# Patient Record
Sex: Female | Born: 1985 | Race: White | Hispanic: No | Marital: Married | Smoking: Current every day smoker
Health system: Southern US, Community
[De-identification: ages and names within clinical notes are randomized; demographics above are authoritative.]

## PROBLEM LIST (undated history)

## (undated) DIAGNOSIS — E7800 Pure hypercholesterolemia, unspecified: Secondary | ICD-10-CM

## (undated) DIAGNOSIS — T7840XA Allergy, unspecified, initial encounter: Secondary | ICD-10-CM

## (undated) HISTORY — DX: Allergy, unspecified, initial encounter: T78.40XA

## (undated) HISTORY — PX: TONSILLECTOMY: SUR1361

---
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
## 2004-07-26 ENCOUNTER — Ambulatory Visit: Payer: Self-pay | Admitting: Family Medicine

## 2004-12-22 ENCOUNTER — Ambulatory Visit: Payer: Self-pay | Admitting: Family Medicine

## 2005-01-04 ENCOUNTER — Ambulatory Visit: Payer: Self-pay | Admitting: Family Medicine

## 2005-05-18 ENCOUNTER — Ambulatory Visit: Payer: Self-pay | Admitting: Family Medicine

## 2005-08-02 ENCOUNTER — Ambulatory Visit: Payer: Self-pay | Admitting: Family Medicine

## 2006-06-22 ENCOUNTER — Inpatient Hospital Stay (HOSPITAL_COMMUNITY): Admission: AD | Admit: 2006-06-22 | Discharge: 2006-06-22 | Payer: Self-pay | Admitting: Obstetrics and Gynecology

## 2006-07-09 ENCOUNTER — Inpatient Hospital Stay (HOSPITAL_COMMUNITY): Admission: AD | Admit: 2006-07-09 | Discharge: 2006-07-09 | Payer: Self-pay | Admitting: Obstetrics & Gynecology

## 2006-08-09 ENCOUNTER — Inpatient Hospital Stay (HOSPITAL_COMMUNITY): Admission: AD | Admit: 2006-08-09 | Discharge: 2006-08-09 | Payer: Self-pay | Admitting: Obstetrics and Gynecology

## 2006-08-11 ENCOUNTER — Inpatient Hospital Stay (HOSPITAL_COMMUNITY): Admission: AD | Admit: 2006-08-11 | Discharge: 2006-08-11 | Payer: Self-pay | Admitting: Obstetrics & Gynecology

## 2006-08-16 ENCOUNTER — Inpatient Hospital Stay (HOSPITAL_COMMUNITY): Admission: AD | Admit: 2006-08-16 | Discharge: 2006-08-16 | Payer: Self-pay | Admitting: Obstetrics and Gynecology

## 2006-08-21 ENCOUNTER — Inpatient Hospital Stay (HOSPITAL_COMMUNITY): Admission: AD | Admit: 2006-08-21 | Discharge: 2006-08-24 | Payer: Self-pay | Admitting: Obstetrics and Gynecology

## 2009-02-08 ENCOUNTER — Inpatient Hospital Stay (HOSPITAL_COMMUNITY): Admission: AD | Admit: 2009-02-08 | Discharge: 2009-02-08 | Payer: Self-pay | Admitting: Obstetrics and Gynecology

## 2009-02-23 ENCOUNTER — Inpatient Hospital Stay (HOSPITAL_COMMUNITY): Admission: AD | Admit: 2009-02-23 | Discharge: 2009-02-23 | Payer: Self-pay | Admitting: Obstetrics and Gynecology

## 2009-02-24 ENCOUNTER — Inpatient Hospital Stay (HOSPITAL_COMMUNITY): Admission: RE | Admit: 2009-02-24 | Discharge: 2009-02-27 | Payer: Self-pay | Admitting: Obstetrics and Gynecology

## 2009-02-24 ENCOUNTER — Encounter (HOSPITAL_COMMUNITY): Payer: Self-pay | Admitting: Obstetrics and Gynecology

## 2010-12-20 LAB — CBC
HCT: 34.1 % — ABNORMAL LOW (ref 36.0–46.0)
Hemoglobin: 12.1 g/dL (ref 12.0–15.0)
MCHC: 35.5 g/dL (ref 30.0–36.0)
MCV: 88.6 fL (ref 78.0–100.0)
MCV: 87.8 fL (ref 78.0–100.0)
Platelets: 216 10*3/uL (ref 150–400)
RBC: 3.85 MIL/uL — ABNORMAL LOW (ref 3.87–5.11)
RBC: 3.96 MIL/uL (ref 3.87–5.11)
RDW: 14.7 % (ref 11.5–15.5)
WBC: 17.9 10*3/uL — ABNORMAL HIGH (ref 4.0–10.5)
WBC: 13.7 10*3/uL — ABNORMAL HIGH (ref 4.0–10.5)

## 2010-12-20 LAB — TYPE AND SCREEN
ABO/RH(D): A POS
Antibody Screen: NEGATIVE

## 2010-12-20 LAB — ABO/RH: ABO/RH(D): A POS

## 2010-12-20 LAB — RPR: RPR Ser Ql: NONREACTIVE

## 2010-12-21 LAB — COMPREHENSIVE METABOLIC PANEL
ALT: 11 U/L (ref 0–35)
Albumin: 2.5 g/dL — ABNORMAL LOW (ref 3.5–5.2)
Alkaline Phosphatase: 106 U/L (ref 39–117)
Calcium: 9.5 mg/dL (ref 8.4–10.5)
Potassium: 3.7 mEq/L (ref 3.5–5.1)
Sodium: 139 mEq/L (ref 135–145)
Total Protein: 6 g/dL (ref 6.0–8.3)

## 2010-12-21 LAB — CBC
MCHC: 35.3 g/dL (ref 30.0–36.0)
Platelets: 239 10*3/uL (ref 150–400)
RDW: 13.8 % (ref 11.5–15.5)

## 2010-12-21 LAB — URIC ACID: Uric Acid, Serum: 5.6 mg/dL (ref 2.4–7.0)

## 2011-01-25 NOTE — Op Note (Signed)
Mia Rodgers, Mia Rodgers                 ACCOUNT NO.:  [id number]   MEDICAL RECORD NO.:  [id number]          PATIENT TYPE:  INP   LOCATION:  9109                          FACILITY:  [facility]   PHYSICIAN:  Zelphia Cairo, MD    DATE OF BIRTH:  Mar 15, 1986   DATE OF PROCEDURE:  02/24/2009  DATE OF DISCHARGE:  02/23/2009                               OPERATIVE REPORT   PREOPERATIVE DIAGNOSES:  1. Intrauterine pregnancy at term.  2. Prior cesarean section, desires repeat.  3. Desires permanent sterilization.   PROCEDURE:  1. Repeat low transverse cesarean delivery.  2. Bilateral partial salpingectomy.   SURGEON:  Zelphia Cairo, MD   ANESTHESIA:  Spinal.   FINDINGS:  Viable female infant with Apgars of 2 and 9, pH 7.25, weight 7  pounds 13 ounces, normal pelvic anatomy.   SPECIMEN:  Placenta for disposal.   BLOOD LOSS:  750 mL.   COMPLICATIONS:  None.   CONDITION:  Stable to recovery room.   PROCEDURE:  Tira was taken to the operating room, where spinal  anesthesia was found to be adequate.  She was prepped and draped in  sterile fashion, and a Foley catheter was inserted sterilely.  Pfannenstiel skin incision was made with the scalpel and carried down to  the underlying superior portion of the fascia, and the underlying rectus  muscles were dissected off using curved Mayo scissors.  The inferior  portion of the fascia was then tented upwards with Kochers and the  underlying muscles were dissected off using curved Mayo scissors.  Peritoneum was then identified and entered sharply using the scalpel.  This was extended superiorly and inferiorly with good visualization of  the bladder.  The bladder blade was then inserted.  Vesicouterine  peritoneum was dissected off the lower uterine segment bluntly.  Bladder  blade was then reinserted.   Uterine incision was made with a scalpel and extended bluntly using my  fingers.  Fetal vertex was brought to the uterine incision.  Vacuum  applicator was used.  Nuchal cord x1 was easily reduced.  Shoulders and  body easily followed with fundal pressure.  Cord was clamped and cut as  the infant was suctioned and taken to the awaiting pediatric staff.  Placenta was then manually removed from the uterus.  The uterus was  cleared of all clots and debris using a dry lap sponge.  Uterus was  exteriorized from the pelvis.  Uterine incision was reapproximated in  double layer closure using 0 chromic in a running locked fashion.  Once  hemostasis was assured, our attention was turned to the fallopian tubes.   Left fallopian tube was grasped with a Babcock clamp.  A small incision  was made in the mesosalpinx.  Plain gut suture was used to doubly ligate  a knuckle of fallopian tube.  This was excised and handed off to be sent  to Pathology.  Once hemostasis was assured, procedure was repeated on  the opposite fallopian tube.  Our attention was returned to the uterine  incision which was again found to be hemostatic.  Uterus was  placed back  into the pelvic cavity.  The pelvis was copiously irrigated with warm  normal saline.  Uterine incision was reinspected and hemostatic.  Peritoneum was reapproximated using 0 Monocryl.  Fascia was closed with  a looped 0 PDS, subcutaneous tissue was reapproximated with a 2-0 plain  gut and the skin was closed with staples.  Sponge lap, needle, and  instrument counts were correct x2.  She was taken to the recovery room  in stable condition.      Zelphia Cairo, MD  Electronically Signed     GA/MEDQ  D:  02/24/2009  T:  02/24/2009  Job:  [id number]

## 2011-01-25 NOTE — Discharge Summary (Signed)
Mia Rodgers, Mia Rodgers                 ACCOUNT NO.:  [id number]   MEDICAL RECORD NO.:  [id number]          PATIENT TYPE:  INP   LOCATION:  9109                          [facility]ACILITY:  [facility]   PHYSICIAN:  Zelphia Cairo, MD    DATE OF BIRTH:  [birth year]   DATE OF ADMISSION:  02/24/2009  DATE OF DISCHARGE:  02/27/2009                               DISCHARGE SUMMARY   ADMITTING DIAGNOSES:  1. Intrauterine pregnancy at term.  2. Previous cesarean section, desires repeat.  3. Multiparity, desires permanent sterilization.   DISCHARGE DIAGNOSES:  1. Status post low transverse cesarean section.  2. Viable female infant.   PROCEDURES:  1. Repeat low transverse cesarean section.  2. Bilateral tubal ligation.   REASON FOR ADMISSION:  Please see written H and P.   HOSPITAL COURSE:  The patient is a 25 year old gravida 4, para 1 that  presented to [facility] for scheduled cesarean section.  The patient had a previous cesarean delivery, desired repeat.  Due to  multiparity, the patient had also requested permanent sterilization.  On  the morning of admission, the patient was taken to the operating room  where spinal anesthesia was administered without difficulty.  A low  transverse incision was made with delivery of a viable female infant,  weighing 7 pounds 13 ounces with Apgars of 2 at 1-minute and 9 at 5  minutes.  Arterial cord pH of 7.25.  Bilateral tubal ligation was  performed without difficulty.  The patient tolerated the procedure well  and taken to the recovery room in stable condition.  On postoperative  day #1, the patient was without complaint.  Vital signs were stable.  She was afebrile.  Abdomen was soft.  Fundus, firm and nontender.  Laboratory findings showed hemoglobin of 12.0.  On postoperative day #2,  the patient was without complaint.  She was considered an early  discharge.  However, there was some difficulty with pain med related to  circumcision.  Vital signs  were stable.  She was afebrile.  Abdomen was  soft.  Fundus, firm and nontender.  Incision was clean, dry, and intact.   Discharge instructions were reviewed with the patient.  On postoperative  day #3, the patient was without complaint.  Vital signs were stable.  She was afebrile.  Fundus was firm and nontender with small blister from  the tape noted on bilateral distal margins of the incisional site.  The  patient was ambulating well.  Discharge instructions reviewed and the  patient was later discharged home.   CONDITION ON DISCHARGE:  Stable.   DIET:  Regular as tolerated.   ACTIVITY:  No heavy lifting, no driving x2 weeks, no vaginal entry.   FOLLOWUP:  The patient is to follow up in the office in 2-3 days for  staple removal.  She is to call for temperature greater than 100  degrees, persistent nausea, vomiting, heavy vaginal bleeding and/or  redness or drainage from incisional site.   DISCHARGE MEDICATIONS:  1. Tylox #30 one p.o. every 4-6 hours.  2. Motrin 600 mg every 6 hours.  3. Prenatal vitamins one p.o. daily.  4. Colace one p.o. daily.      Julio Sicks, N.P.      Zelphia Cairo, MD  Electronically Signed    CC/MEDQ  D:  02/27/2009  T:  02/27/2009  Job:  [id number]

## 2011-01-28 NOTE — Discharge Summary (Signed)
NAME:  Mia Rodgers, Mia Rodgers                 ACCOUNT NO.:  [id number]   MEDICAL RECORD NO.:  [id number]          PATIENT TYPE:  INP   LOCATION:  9125                          FACILITY:  [facility]   PHYSICIAN:  Michelle L. Grewal, M.D.DATE OF BIRTH:  1986/02/04   DATE OF ADMISSION:  08/21/2006  DATE OF DISCHARGE:  08/24/2006                               DISCHARGE SUMMARY   ADMISSION DIAGNOSES:  1. Intrauterine pregnancy at 40-3/7 weeks estimated gestational age.  2. Induction of labor.   DISCHARGE DIAGNOSES:  1. Status post low transverse cesarean section.  2. Viable female infant.   PROCEDURE:  Primary low transverse cesarean section.   REASON FOR ADMISSION:  Please see written H&P.   HOSPITAL COURSE:  The patient is a 25 year old, Gravida 3, para 0 that  was admitted to [facility] at 40-4/7 weeks estimated  gestational age for post dates induction of labor.  On admission, vital  signs were stable.  Fetal heart tones were reassuring.  Cervix was  dilated at 2-3 cm, 50% effaced, vertex at -2 station.  Artificial  rupture of membranes was performed which revealed clear fluid.  The  patient was started on group B beta strep prophylaxis and IV Pitocin for  augmentation of her labor.  Throughout the day, the patient became  somewhat uncomfortable.  Fetal heart tones were in the 130s which  continued to be reassuring.  Tachometer was revealing contractions that  were approximately every 1-2 minutes.  Cervix was essentially unchanged  after several hours of labor.  The decision was made to proceed with a  primary low transverse cesarean section.  The patient was then  transferred to the Operating Room where spinal anesthesia was  administered without difficulty.  A low transverse incision was made.  The delivery of a viable female infant weighing 7 pounds 13 ounces with  Apgars of 9 at 1 and 9 at 5 minutes.  The patient tolerated the  procedure well and was taken to the Recovery Room  in stable condition.  On postoperative day one, the patient was without complaint.  Vital  signs were stable.  Her abdomen was soft with good return of bowel  function.  Fundus was firm and nontender.  Abdominal dressing noted to  be clean, dry and intact.  Laboratory findings revealed hemoglobin of  11.4, platelet count 213,000, WBC count of 6.7.  On postoperative day  two, the patient did desire early discharge.  Vital signs were stable.  She was afebrile.  Abdomen was soft.  Fundus firm and nontender.  Abdominal dressing was removed revealing an incision that was clean, dry  and intact.  The patient was ambulating well, tolerating a regular diet  without complaints of nausea and vomiting.  Discharge instructions  reviewed and the patient was later discharged home.   CONDITION ON DISCHARGE:  Stable.   DIET:  Regular as tolerated.   ACTIVITY:  No heavy lifting, no driving times two weeks, no vaginal  entry.   FOLLOW-UP:  The patient is to follow up in the office in two days for  staple  removal.  She is to call for temperature greater than 100  degrees, persistent nausea, vomiting, heavy vaginal bleeding and/or  redness or drainage from the incisional site.   DISCHARGE MEDICATIONS:  1. Percocet 5/325, #30, one p.o. every four to six hours p.r.n.  2. Motrin 600 mg every 6 hours.  3. Prenatal vitamins one p.o. daily.      Julio Sicks, N.P.      Stann Mainland. Vincente Poli, M.D.  Electronically Signed    CC/MEDQ  D:  09/13/2006  T:  09/13/2006  Job:  [id number]

## 2011-01-28 NOTE — Op Note (Signed)
NAME:  Mia Rodgers, Mia Rodgers                 ACCOUNT NO.:  [id number]   MEDICAL RECORD NO.:  [id number]          PATIENT TYPE:  INP   LOCATION:  9125                          FACILITY:  [facility]   PHYSICIAN:  Zelphia Cairo, MD    DATE OF BIRTH:  20-Jan-1986   DATE OF PROCEDURE:  08/22/2006  DATE OF DISCHARGE:                               OPERATIVE REPORT   PREOPERATIVE DIAGNOSIS:  1. Intrauterine pregnancy at 40 plus 4 weeks.  2. Failed induction.   POSTOPERATIVE DIAGNOSIS:  1. Intrauterine pregnancy at 40 plus 4 weeks.  2. Failed induction.   PROCEDURE:  Primary low transverse cesarean delivery.   SURGEON:  Zelphia Cairo, M.D.   ESTIMATED BLOOD LOSS:  700 mL.   FINDINGS:  Female infant with Apgars of 9 and 9.  Weight 3560 grams.  Normal pelvic anatomy.   ANESTHESIA:  Spinal.   COMPLICATIONS:  None.   CONDITION:  Stable to recovery room.   DESCRIPTION OF PROCEDURE:  The patient was taken to the operating room  where anesthesia was found to be adequate.  She was placed in a supine  position with a left tilt.  She was prepped and draped in sterile  fashion.  A Foley catheter was inserted sterilely.  A Pfannenstiel skin  incision was made with the scalpel and carried down to the underlying  fascia.  The fascia was incised in the midline and extended laterally  using Mayo scissors.  The superior portion of the fascial was then  grasped with Kocher clamps, tented upwards, and the underlying rectus  muscles dissected off using the Bovie.  The inferior portion of the  fascia was then grasped with Kocher clamps, tented upwards, and the  underlying rectus muscles dissected off using the Bovie.  The peritoneum  was then identified, tented upwards with a hemostat, and entered sharply  using Metzenbaum scissors.  This was extended superiorly and inferiorly  with good visualization of the bladder.  The bladder blade was then  inserted and the bladder flap was created using Metzenbaum  scissors.  The bladder blade was then reinserted to protect the bladder flap.   A uterine incision was then made with a scalpel and bluntly extended.  Bandage scissors were then used to broaden the uterine incision.  The  infant's vertex was then brought to the incision and the nose and mouth  were suctioned.  The rest of the body followed without problems.  The  cord was clamped and cut and the infant was taken to the awaiting  pediatric staff.  Next, the placenta was manually removed from the  uterus.  A dry lap sponge was used to clean the uterus of all clots and  debris.  The uterine incision was reapproximated using chromic in a  running locked fashion.  Excellent hemostasis was assured and the pelvis  was copiously irrigated with warm normal saline.  The  peritoneum was then closed with 0 Vicryl, the fascia was closed with a  looped 0 PDS, and the skin was closed with staples.  The patient  tolerated the procedure well.  Sponge, lap and needle counts were  correct x 2.  She was taken to the recovery room in stable condition.      Zelphia Cairo, MD  Electronically Signed     GA/MEDQ  D:  08/22/2006  T:  08/22/2006  Job:  [id number]

## 2011-04-10 ENCOUNTER — Other Ambulatory Visit: Payer: Self-pay

## 2011-04-10 ENCOUNTER — Emergency Department (HOSPITAL_COMMUNITY)
Admission: EM | Admit: 2011-04-10 | Discharge: 2011-04-10 | Discharge status: Against Medical Advice | Payer: Self-pay | Attending: Emergency Medicine | Admitting: Emergency Medicine

## 2011-04-10 ENCOUNTER — Emergency Department (HOSPITAL_COMMUNITY): Payer: Self-pay

## 2011-04-10 DIAGNOSIS — Z0389 Encounter for observation for other suspected diseases and conditions ruled out: Secondary | ICD-10-CM | POA: Insufficient documentation

## 2011-04-10 HISTORY — DX: Pure hypercholesterolemia, unspecified: E78.00

## 2013-05-15 ENCOUNTER — Encounter (HOSPITAL_COMMUNITY): Payer: Self-pay | Admitting: *Deleted

## 2013-05-15 ENCOUNTER — Emergency Department (HOSPITAL_COMMUNITY)
Admission: EM | Admit: 2013-05-15 | Discharge: 2013-05-15 | Disposition: A | Discharge status: Home / Self Care | Payer: Medicaid Other | Attending: Emergency Medicine | Admitting: Emergency Medicine

## 2013-05-15 ENCOUNTER — Emergency Department (HOSPITAL_COMMUNITY): Payer: Medicaid Other

## 2013-05-15 DIAGNOSIS — Z862 Personal history of diseases of the blood and blood-forming organs and certain disorders involving the immune mechanism: Secondary | ICD-10-CM | POA: Insufficient documentation

## 2013-05-15 DIAGNOSIS — F17200 Nicotine dependence, unspecified, uncomplicated: Secondary | ICD-10-CM | POA: Insufficient documentation

## 2013-05-15 DIAGNOSIS — J45901 Unspecified asthma with (acute) exacerbation: Secondary | ICD-10-CM | POA: Insufficient documentation

## 2013-05-15 DIAGNOSIS — R079 Chest pain, unspecified: Secondary | ICD-10-CM | POA: Insufficient documentation

## 2013-05-15 DIAGNOSIS — E7800 Pure hypercholesterolemia, unspecified: Secondary | ICD-10-CM | POA: Insufficient documentation

## 2013-05-15 DIAGNOSIS — R61 Generalized hyperhidrosis: Secondary | ICD-10-CM | POA: Insufficient documentation

## 2013-05-15 DIAGNOSIS — R209 Unspecified disturbances of skin sensation: Secondary | ICD-10-CM | POA: Insufficient documentation

## 2013-05-15 LAB — CBC WITH DIFFERENTIAL/PLATELET
Basophils Absolute: 0 10*3/uL (ref 0.0–0.1)
Basophils Relative: 0 % (ref 0–1)
HCT: 43.7 % (ref 36.0–46.0)
Hemoglobin: 15.2 g/dL — ABNORMAL HIGH (ref 12.0–15.0)
Lymphocytes Relative: 40 % (ref 12–46)
Lymphs Abs: 6.8 10*3/uL — ABNORMAL HIGH (ref 0.7–4.0)
MCV: 87.9 fL (ref 78.0–100.0)
Monocytes Relative: 8 % (ref 3–12)
Neutro Abs: 7.8 10*3/uL — ABNORMAL HIGH (ref 1.7–7.7)
RDW: 14 % (ref 11.5–15.5)
WBC: 17 10*3/uL — ABNORMAL HIGH (ref 4.0–10.5)

## 2013-05-15 LAB — BASIC METABOLIC PANEL
BUN: 10 mg/dL (ref 6–23)
CO2: 27 mEq/L (ref 19–32)
Chloride: 98 mEq/L (ref 96–112)
Creatinine, Ser: 0.85 mg/dL (ref 0.50–1.10)
GFR calc Af Amer: >90 mL/min (ref >90)
Potassium: 3.6 mEq/L (ref 3.5–5.1)

## 2013-05-15 LAB — TROPONIN I: Troponin I: <0.3 ng/mL (ref <0.30)

## 2013-05-15 NOTE — ED Provider Notes (Addendum)
CSN: [id number]     Arrival date & time 05/15/13  1832 History  This chart was scribed for Mia Hutching, MD by Danella Maiers, ED Scribe. This patient was seen in room APA06/APA06 and the patient's care was started at 6:40 PM.    Chief Complaint  Patient presents with  . Chest Pain   (Consider location/radiation/quality/duration/timing/severity/associated sxs/prior Treatment) The history is provided by the patient. No language interpreter was used.   HPI Comments: Mia Rodgers is a 27 y.o. female who presents to the Emergency Department complaining of constant central CP that radiates to her left breast and back with associated left arm numbness, SOB, and diaphoresis that began at 3pm today (4 hours ago). She describes the CP as a "tightening" sensation. She denies nausea. She has a history of asthma and high cholesterol. She has a family history of CHF - her mother had a CABG and died from CHF at age 50. She also has a family history (grandfather) of a heart attack. She is a current every day smoker.   PCP- Dr. Prudy Feeler  Past Medical History  Diagnosis Date  . Asthma   . Hypercholesterolemia   . Leukemia    Past Surgical History  Procedure Laterality Date  . Tonsillectomy    . Cesarean section     No family history on file. History  Substance Use Topics  . Smoking status: Current Every Day Smoker -- 0.50 packs/day  . Smokeless tobacco: Not on file  . Alcohol Use: No   OB History   Grav Para Term Preterm Abortions TAB SAB Ect Mult Living                 Review of Systems ROS A complete 10 system review of systems was obtained and all systems are negative except as noted in the HPI and PMH.     Allergies  Vicodin  Home Medications   Current Outpatient Rx  Name  Route  Sig  Dispense  Refill  . albuterol (PROVENTIL) (5 MG/ML) 0.5% nebulizer solution   Nebulization   Take 2.5 mg by nebulization every 6 (six) hours as needed.           . simvastatin (ZOCOR) 10 MG  tablet   Oral   Take 10 mg by mouth at bedtime.            BP 133/78  Pulse 98  Temp(Src) 98.2 F (36.8 C) (Oral)  Resp 16  Ht 5\' 5"  (1.651 m)  Wt 213 lb (96.616 kg)  BMI 35.44 kg/m2  SpO2 98%  LMP 04/16/2013 Physical Exam....... Constitutional well-developed well-nourished.      HEENT normocephalic atraumatic....... Eyes conjunctiva normal. Neck full range of motion. Cardiovascular normal rate and rhythm. Heart sounds normal. Lungs clear to auscultation percussion. Abdomen soft nontender. Musculoskeletal normal range of motion. Neurologic alert and oriented x3. No neuro deficits. Skin warm and dry. Psych normal mood and affect.  ED Course  Procedures (including critical care time) Medications - No data to display  DIAGNOSTIC STUDIES: Oxygen Saturation is 98% on room air, normal by my interpretation.    COORDINATION OF CARE: 7:11 PM- Discussed treatment plan with pt which includes advising the pt about smoking cessation, EKG, CXR, CBC, BMP, and troponin and pt agrees to plan.    Labs Review Labs Reviewed  CBC WITH DIFFERENTIAL - Abnormal; Notable for the following:    WBC 17.0 (*)    Hemoglobin 15.2 (*)    Eosinophils Relative 6 (*)  Neutro Abs 7.8 (*)    Lymphs Abs 6.8 (*)    Monocytes Absolute 1.4 (*)    Eosinophils Absolute 1.0 (*)    All other components within normal limits  BASIC METABOLIC PANEL  TROPONIN I   Imaging Review Dg Chest 2 View  05/15/2013   *RADIOLOGY REPORT*  Clinical Data: Chest pain  CHEST - 2 VIEW  Comparison: None.  Findings: The heart and pulmonary vascularity are within normal limits.  The lungs are clear bilaterally.  No acute bony abnormality is seen.  IMPRESSION: No acute abnormality noted.   Original Report Authenticated By: Alcide Clever, M.D.    Date: 05/15/2013  Rate: 95  Rhythm: normal sinus rhythm  QRS Axis: normal  Intervals: normal  ST/T Wave abnormalities: normal  Conduction Disutrbances: none  Narrative Interpretation:  unremarkable    MDM  No diagnosis found.  Pain has lessened. Patient wants to go home.  We discussed her cardiac risk factors.   I strongly recommended further follow up with cardiologist.  Patient agrees to return if worse.      I personally performed the services described in this documentation, which was scribed in my presence. The recorded information has been reviewed and is accurate.    Mia Hutching, MD 05/15/13 [id number]  Mia Hutching, MD 05/27/13 [id number]  Mia Hutching, MD 05/27/13 1904  Mia Hutching, MD 05/27/13 [phone]

## 2013-05-15 NOTE — ED Notes (Signed)
Pt states central chest pain, radiating to back with left arm numbness. States pain began at ~ 1500, described as "tightening" sensation.

## 2013-06-19 ENCOUNTER — Encounter: Payer: Medicaid Other | Admitting: Cardiovascular Disease

## 2016-02-13 ENCOUNTER — Other Ambulatory Visit (HOSPITAL_COMMUNITY): Payer: Self-pay | Admitting: General Practice

## 2016-02-13 ENCOUNTER — Other Ambulatory Visit (HOSPITAL_COMMUNITY): Payer: Self-pay

## 2016-02-13 ENCOUNTER — Ambulatory Visit (HOSPITAL_COMMUNITY)
Admission: RE | Admit: 2016-02-13 | Discharge: 2016-02-13 | Disposition: A | Discharge status: Home / Self Care | Payer: Self-pay | Source: Ambulatory Visit | Attending: Emergency Medicine | Admitting: Emergency Medicine

## 2016-02-13 ENCOUNTER — Ambulatory Visit (HOSPITAL_COMMUNITY): Admission: RE | Admit: 2016-02-13 | Payer: Self-pay | Source: Ambulatory Visit

## 2016-02-13 DIAGNOSIS — R221 Localized swelling, mass and lump, neck: Secondary | ICD-10-CM

## 2016-02-13 DIAGNOSIS — J029 Acute pharyngitis, unspecified: Secondary | ICD-10-CM | POA: Insufficient documentation

## 2016-02-13 DIAGNOSIS — R509 Fever, unspecified: Secondary | ICD-10-CM | POA: Insufficient documentation

## 2016-02-13 MED ORDER — IOPAMIDOL (ISOVUE-300) INJECTION 61%
75.0000 mL | Freq: Once | INTRAVENOUS | Status: AC | PRN
  Administered 2016-02-13: 75 mL via INTRAVENOUS

## 2016-06-22 ENCOUNTER — Other Ambulatory Visit: Payer: Self-pay | Admitting: *Deleted

## 2016-06-22 NOTE — Telephone Encounter (Signed)
Last filled 05/21/16. Route to pool A

## 2016-08-23 ENCOUNTER — Other Ambulatory Visit: Payer: Self-pay | Admitting: Physician Assistant

## 2016-08-31 ENCOUNTER — Ambulatory Visit: Payer: Self-pay | Admitting: Physician Assistant

## 2016-11-28 ENCOUNTER — Encounter (HOSPITAL_COMMUNITY): Payer: Self-pay | Admitting: Emergency Medicine

## 2016-11-28 ENCOUNTER — Emergency Department (HOSPITAL_COMMUNITY)
Admission: EM | Admit: 2016-11-28 | Discharge: 2016-11-28 | Disposition: A | Discharge status: Home / Self Care | Payer: Self-pay | Attending: Dermatology | Admitting: Dermatology

## 2016-11-28 ENCOUNTER — Emergency Department (HOSPITAL_COMMUNITY): Payer: Self-pay

## 2016-11-28 DIAGNOSIS — J45909 Unspecified asthma, uncomplicated: Secondary | ICD-10-CM | POA: Insufficient documentation

## 2016-11-28 DIAGNOSIS — M542 Cervicalgia: Secondary | ICD-10-CM | POA: Insufficient documentation

## 2016-11-28 DIAGNOSIS — Z5321 Procedure and treatment not carried out due to patient leaving prior to being seen by health care provider: Secondary | ICD-10-CM | POA: Insufficient documentation

## 2016-11-28 DIAGNOSIS — F17200 Nicotine dependence, unspecified, uncomplicated: Secondary | ICD-10-CM | POA: Insufficient documentation

## 2016-11-28 LAB — BASIC METABOLIC PANEL
Anion gap: 10 (ref 5–15)
BUN: 12 mg/dL (ref 6–20)
CALCIUM: 9.5 mg/dL (ref 8.9–10.3)
CO2: 26 mmol/L (ref 22–32)
Chloride: 101 mmol/L (ref 101–111)
Creatinine, Ser: 0.77 mg/dL (ref 0.44–1.00)
GFR calc Af Amer: >60 mL/min (ref >60)
GFR calc non Af Amer: >60 mL/min (ref >60)
GLUCOSE: 87 mg/dL (ref 65–99)
Potassium: 3.5 mmol/L (ref 3.5–5.1)
Sodium: 137 mmol/L (ref 135–145)

## 2016-11-28 LAB — CBC WITH DIFFERENTIAL/PLATELET
BASOS ABS: 0.1 10*3/uL (ref 0.0–0.1)
Basophils Relative: 0 %
EOS ABS: 1 10*3/uL — AB (ref 0.0–0.7)
EOS PCT: 6 %
HCT: 45.6 % (ref 36.0–46.0)
Hemoglobin: 15.2 g/dL — ABNORMAL HIGH (ref 12.0–15.0)
LYMPHS ABS: 5.6 10*3/uL — AB (ref 0.7–4.0)
LYMPHS PCT: 33 %
MCH: 30.1 pg (ref 26.0–34.0)
MCHC: 33.3 g/dL (ref 30.0–36.0)
MCV: 90.3 fL (ref 78.0–100.0)
MONO ABS: 1.2 10*3/uL — AB (ref 0.1–1.0)
Monocytes Relative: 7 %
Neutro Abs: 9 10*3/uL — ABNORMAL HIGH (ref 1.7–7.7)
Neutrophils Relative %: 54 %
Platelets: 301 10*3/uL (ref 150–400)
RBC: 5.05 MIL/uL (ref 3.87–5.11)
RDW: 14.1 % (ref 11.5–15.5)
WBC: 16.9 10*3/uL — AB (ref 4.0–10.5)

## 2016-11-28 LAB — I-STAT BETA HCG BLOOD, ED (MC, WL, AP ONLY)

## 2016-11-28 NOTE — ED Notes (Signed)
Pt notified of elevated WBC. Pt reported was currently at Urgent Care close to pt home. Pt reported would pass along WBC level to urgent care provider.

## 2016-11-28 NOTE — ED Notes (Signed)
Patient reports that she has to leave to get kids from school.  Patient explained care plan and reports I cant stay.

## 2016-11-28 NOTE — ED Triage Notes (Addendum)
Pt reports left sided neck pain and swelling since Saturday. Pt reports history of same and reports was diagnosed with epiglottis. Airway patent. Pt reports pain with swallowing. nad noted. Pt denies any oral injury,recent dental surgery/abscess. Pt alert and talking.

## 2018-04-15 IMAGING — CT CT NECK W/ CM
3 of 4 series · 13 of 33 positions shown, 16 images · IV contrast (iopamidol)
Comparison: Prior radiograph from earlier same day

CLINICAL DATA: Initial evaluation for acute sore throat,
right-sided swelling.

EXAM:
CT NECK WITH CONTRAST
TECHNIQUE: Multidetector CT imaging of the neck was performed using the
standard protocol following the bolus administration of intravenous
contrast.
CONTRAST:  75mL LHBG0X-TRR IOPAMIDOL (LHBG0X-TRR) INJECTION 61%

[Series 2: axial neck · axial · 0.41mm/px · z∈[+1087,+1245]mm · 5 of 119 slices shown, 7 images]
[im 20/119  soft-tissue]
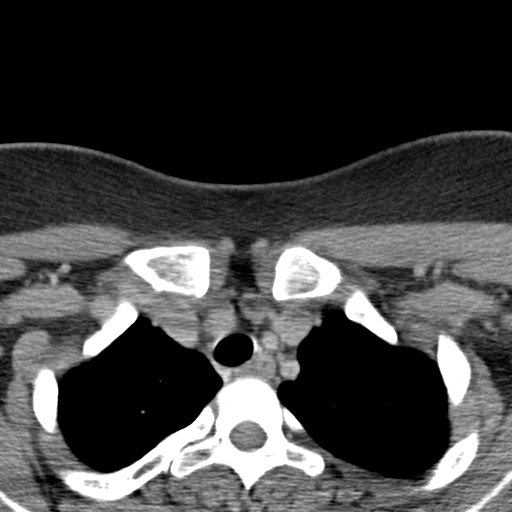
[im 20/119  bone]
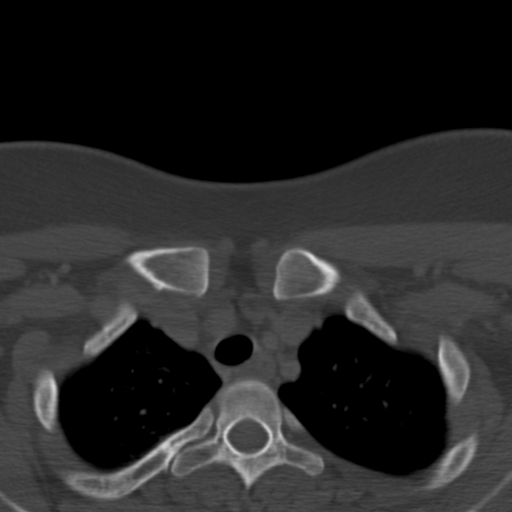
[im 40/119  bone]
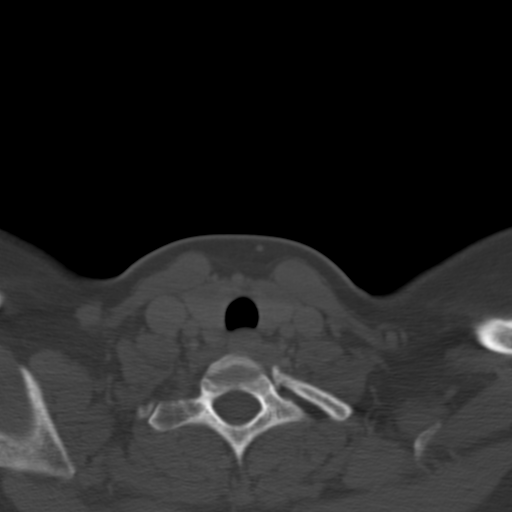
[im 60/119  bone]
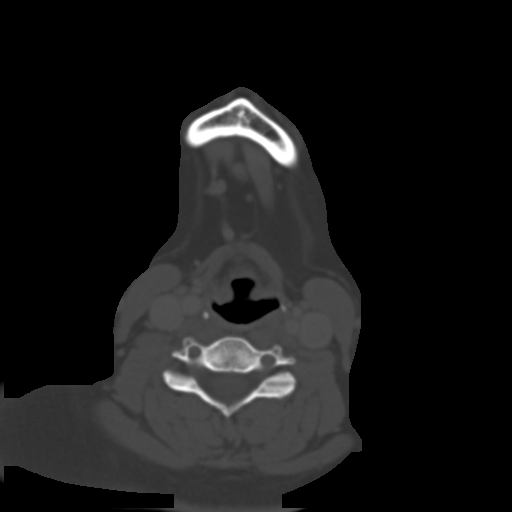
[im 79/119  bone]
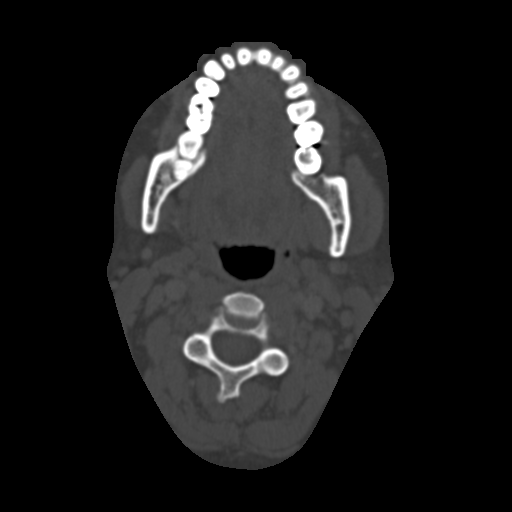
[im 99/119  soft-tissue]
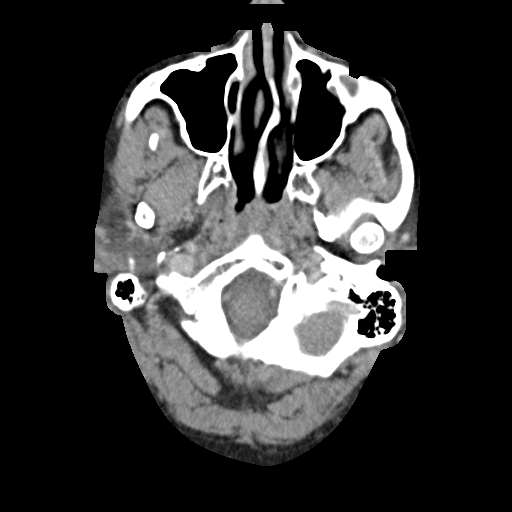
[im 99/119  bone]
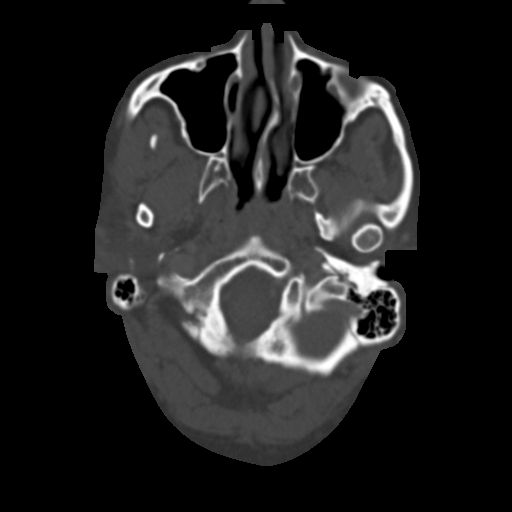

[Series 3: sag neck · sagittal · 0.37mm/px · 5 of 75 slices shown, 6 images]
[im 25/75  bone]
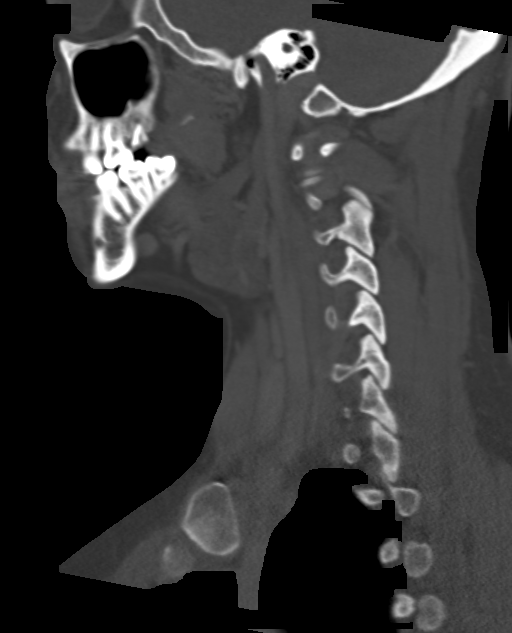
[im 31/75  bone]
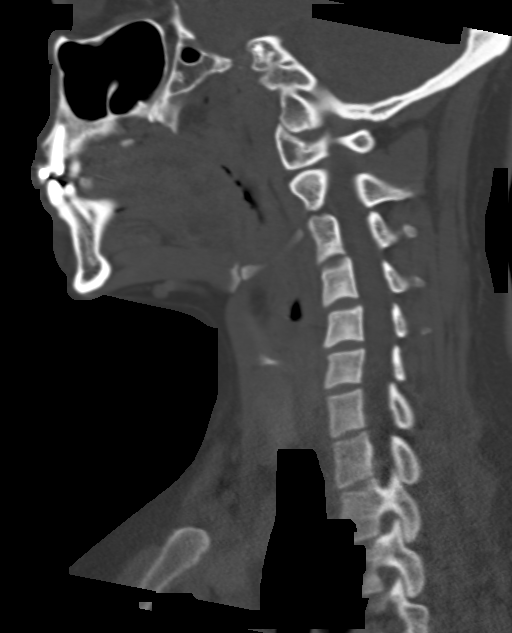
[im 38/75  soft-tissue]
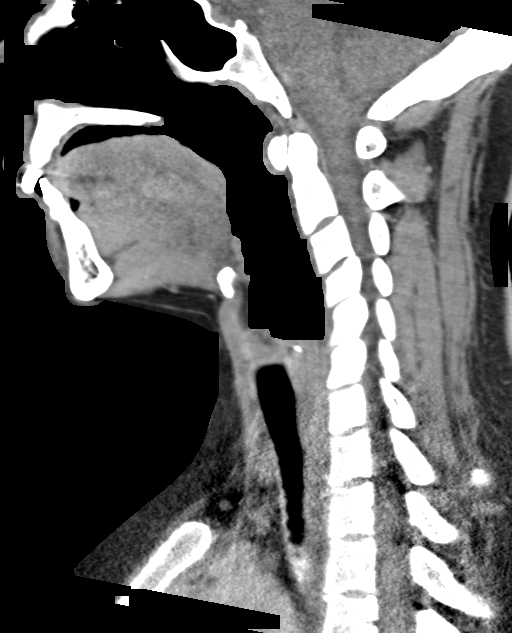
[im 38/75  bone]
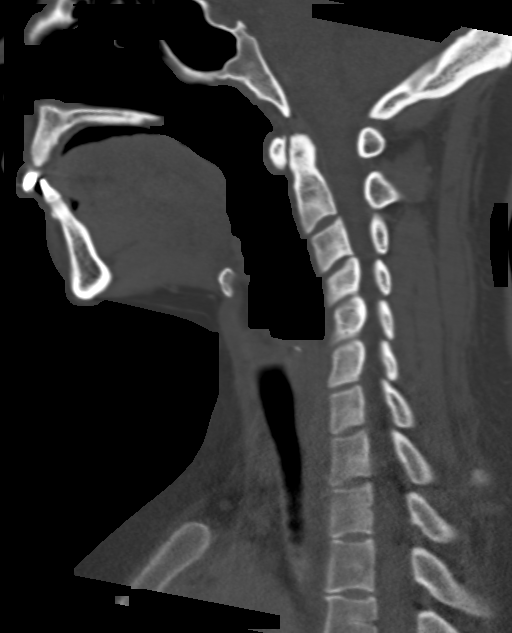
[im 44/75  bone]
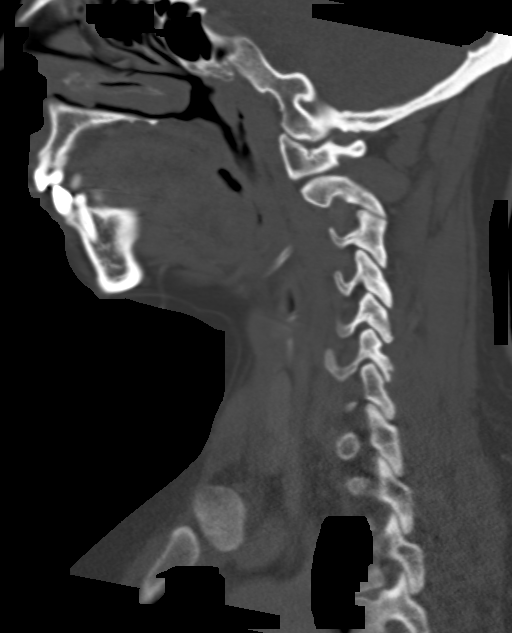
[im 50/75  bone]
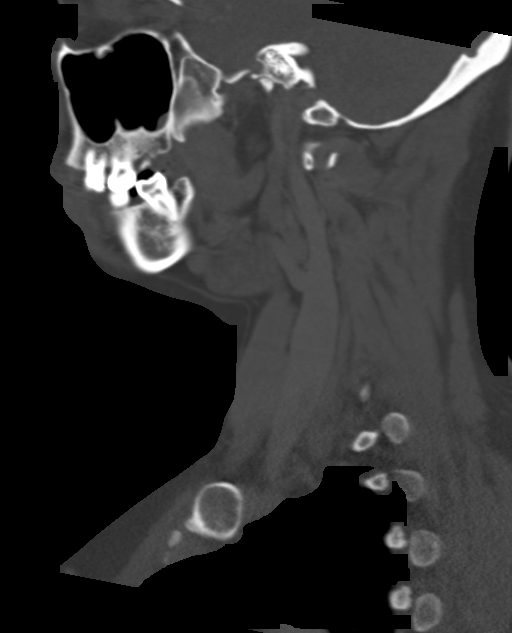

[Series 4: cor neck · coronal · 0.30mm/px · 3 of 89 slices shown]
[im 18/89  bone]
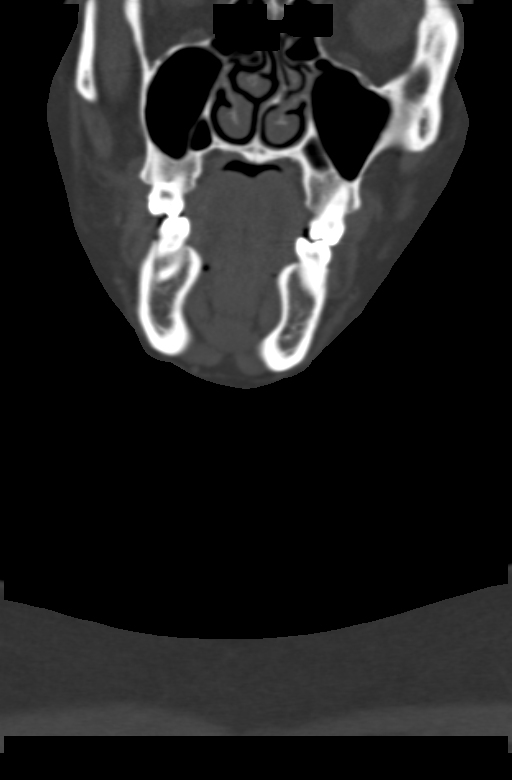
[im 36/89  bone]
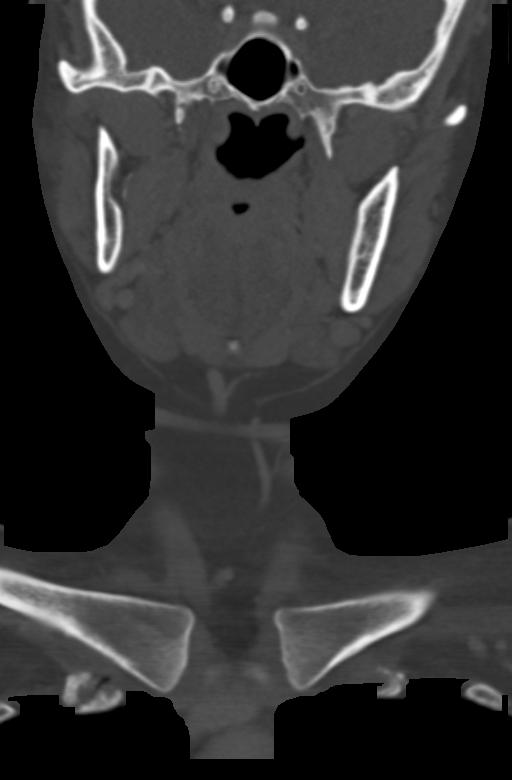
[im 53/89  bone]
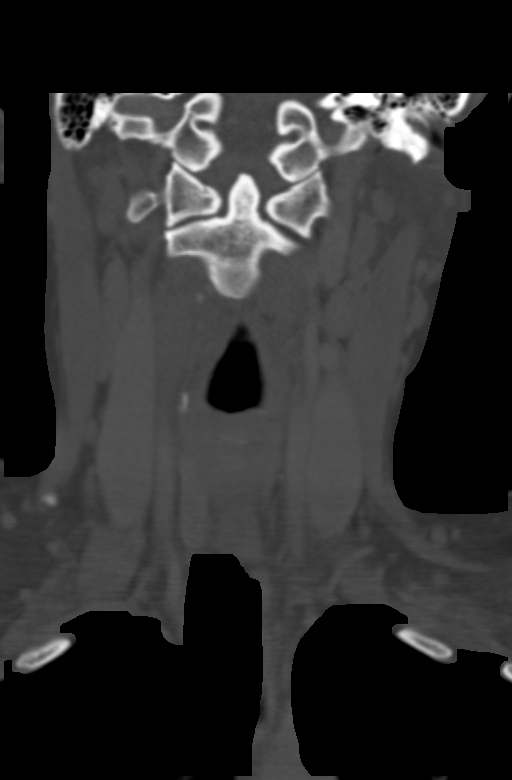

[13 of 33 positions shown; findings below may reference images not displayed]

FINDINGS: Visualized portions of the brain are unremarkable. Partially
visualized globes and orbits within normal limits.

Visualized paranasal sinuses are clear. No mastoid effusion. Middle
ear cavities are clear.

Salivary glands including the parotid glands and submandibular
glands are normal.

Oral cavity within normal limits. No acute abnormality about the
dentition. Dental caries at the maxillary molars bilaterally. No
associated inflammation.

Palatine tonsils normal. Parapharyngeal fat preserved. Nasopharynx
normal. No retropharyngeal fluid collection. There is mild
thickening and edema of the epiglottis, suspicious for possible
acute epiglottitis (series 3, image 37). Changes are slightly worse
on the right with mild swelling extending into the right vallecula
(series 2, image 53). Probable mild edema within the aryepiglottic
folds as well, greater on the right with mild effacement of the
right pharynx and right piriform sinus. Oropharyngeal airway remains
patent. Left piriform sinus clear. Lingual tonsils filled the
vallecula. True cords are opposed inferiorly and not well evaluated.
Subglottic airway is clear.

Thyroid gland normal.

No pathologically enlarged lymph nodes identified within the neck.

Visualized superior mediastinum within normal limits.

Visualized lungs are clear.

Normal intravascular enhancement seen throughout the neck.

No acute osseous abnormality no worrisome lytic or blastic osseous
lesions.
IMPRESSION: Mild swelling and edematous changes of the epiglottis as above,
concerning for acute epiglottitis/supraglottitis.

These results will be called to the ordering clinician or
representative by the Radiologist Assistant, and communication
documented in the PACS or zVision Dashboard.

## 2018-09-21 ENCOUNTER — Encounter (HOSPITAL_COMMUNITY): Payer: Self-pay | Admitting: Emergency Medicine

## 2018-09-21 ENCOUNTER — Emergency Department (HOSPITAL_COMMUNITY): Payer: Self-pay

## 2018-09-21 ENCOUNTER — Emergency Department (HOSPITAL_COMMUNITY)
Admission: EM | Admit: 2018-09-21 | Discharge: 2018-09-21 | Disposition: A | Discharge status: Home / Self Care | Payer: Self-pay | Attending: Emergency Medicine | Admitting: Emergency Medicine

## 2018-09-21 ENCOUNTER — Other Ambulatory Visit: Payer: Self-pay

## 2018-09-21 DIAGNOSIS — J45909 Unspecified asthma, uncomplicated: Secondary | ICD-10-CM | POA: Insufficient documentation

## 2018-09-21 DIAGNOSIS — E7800 Pure hypercholesterolemia, unspecified: Secondary | ICD-10-CM | POA: Insufficient documentation

## 2018-09-21 DIAGNOSIS — F17200 Nicotine dependence, unspecified, uncomplicated: Secondary | ICD-10-CM | POA: Insufficient documentation

## 2018-09-21 DIAGNOSIS — R079 Chest pain, unspecified: Secondary | ICD-10-CM | POA: Insufficient documentation

## 2018-09-21 LAB — CBC WITH DIFFERENTIAL/PLATELET
ABS IMMATURE GRANULOCYTES: 0.07 10*3/uL (ref 0.00–0.07)
Basophils Absolute: 0.1 10*3/uL (ref 0.0–0.1)
Basophils Relative: 1 %
Eosinophils Absolute: 0.5 10*3/uL (ref 0.0–0.5)
Eosinophils Relative: 3 %
HCT: 45.1 % (ref 36.0–46.0)
HEMOGLOBIN: 15.2 g/dL — AB (ref 12.0–15.0)
IMMATURE GRANULOCYTES: 0 %
Lymphocytes Relative: 30 %
Lymphs Abs: 6 10*3/uL — ABNORMAL HIGH (ref 0.7–4.0)
MCH: 30.2 pg (ref 26.0–34.0)
MCHC: 33.7 g/dL (ref 30.0–36.0)
MCV: 89.5 fL (ref 80.0–100.0)
MONOS PCT: 6 %
Monocytes Absolute: 1.1 10*3/uL — ABNORMAL HIGH (ref 0.1–1.0)
NEUTROS ABS: 12.1 10*3/uL — AB (ref 1.7–7.7)
Neutrophils Relative %: 60 %
Platelets: 352 10*3/uL (ref 150–400)
RBC: 5.04 MIL/uL (ref 3.87–5.11)
RDW: 13.7 % (ref 11.5–15.5)
WBC: 19.9 10*3/uL — ABNORMAL HIGH (ref 4.0–10.5)
nRBC: 0 % (ref 0.0–0.2)

## 2018-09-21 LAB — COMPREHENSIVE METABOLIC PANEL
ALBUMIN: 4.4 g/dL (ref 3.5–5.0)
ALK PHOS: 50 U/L (ref 38–126)
ALT: 13 U/L (ref 0–44)
AST: 17 U/L (ref 15–41)
Anion gap: 10 (ref 5–15)
BUN: 10 mg/dL (ref 6–20)
CO2: 23 mmol/L (ref 22–32)
Calcium: 10 mg/dL (ref 8.9–10.3)
Chloride: 105 mmol/L (ref 98–111)
Creatinine, Ser: 0.76 mg/dL (ref 0.44–1.00)
GFR calc Af Amer: >60 mL/min (ref >60)
GFR calc non Af Amer: >60 mL/min (ref >60)
GLUCOSE: 91 mg/dL (ref 70–99)
Potassium: 3.7 mmol/L (ref 3.5–5.1)
Sodium: 138 mmol/L (ref 135–145)
Total Bilirubin: 1 mg/dL (ref 0.3–1.2)
Total Protein: 7.4 g/dL (ref 6.5–8.1)

## 2018-09-21 LAB — I-STAT TROPONIN, ED
Troponin i, poc: 0 ng/mL (ref 0.00–0.08)
Troponin i, poc: 0 ng/mL (ref 0.00–0.08)

## 2018-09-21 LAB — D-DIMER, QUANTITATIVE: D-Dimer, Quant: 0.33 ug/mL-FEU (ref 0.00–0.50)

## 2018-09-21 LAB — I-STAT BETA HCG BLOOD, ED (MC, WL, AP ONLY)

## 2018-09-21 MED ORDER — ASPIRIN 81 MG PO CHEW
324.0000 mg | CHEWABLE_TABLET | Freq: Once | ORAL | Status: AC
  Administered 2018-09-21: 324 mg via ORAL
  Filled 2018-09-21: qty 4

## 2018-09-21 NOTE — ED Triage Notes (Signed)
Patient reports left sided chest pain x 2 days. Patient states pain felt sharp and stabbing into her back earlier this afternoon. Patient also having shortness of breath onset of today, hasn't been able to smoke.

## 2018-09-21 NOTE — Discharge Instructions (Addendum)
You were evaluated in the Emergency Department and after careful evaluation, we did not find any emergent condition requiring admission or further testing in the hospital.  The tests that we did today were reassuring and showed no signs of heart damage.  We are still concerned about your chest pain and advise you to follow-up as soon as you can with a cardiologist or your primary care doctor.  You may benefit from stress testing as an outpatient.  Please return to the Emergency Department if you experience any worsening of your condition.  We encourage you to follow up with a primary care provider.  Thank you for allowing us to be a part of your care.

## 2018-09-21 NOTE — ED Notes (Signed)
Label sent down to pharmacy to run D-dimer onto previous blood draw.

## 2018-09-21 NOTE — ED Notes (Signed)
Patient transported to X-ray 

## 2018-09-21 NOTE — ED Provider Notes (Signed)
[facility] EMERGENCY DEPARTMENT Provider Note   CSN: [id number] Arrival date & time: 09/21/18  1303     History   Chief Complaint Chief Complaint  Patient presents with  . Chest Pain  . Shortness of Breath    HPI Mia Rodgers is a 33 y.o. female.  HPI   Patient is a 33 year old female with history of asthma, hyperlipidemia, who presents the emergency department today for evaluation of chest pain and shortness of breath.  Patient states that for the last 2 days she has had left-sided chest pain just under her left breast.  The pain is been coming and going.  Episodes last for several seconds to minutes and resolve on their own.  They happen at rest and with exertion.  They are not specifically associated with exertion.  Today symptoms worsen and pain radiated across her chest, into her back and made her left arm tingling.  She also became short of breath and diaphoretic.  She denies nausea or vomiting.  She has had a mild cough that began today.  She has had some sputum production.  Denies hemoptysis.  Denies lower extremity swelling or calf pain.  No recent hospital admissions, surgeries or extended periods of travel.  Not on estrogen pills.  No history of DVT/PE.  She notes she has had increased rest recently as she is been in the hospital with her grandfather who has pneumonia.  She has had no fevers or chills.  She states that currently her chest pain has resolved and she does not feel short of breath.  Of note, patient denies any history of hypertension or diabetes.  She does state that her mom had an MI at the age of 66.  She later died of complications from CHF at the age of 24.  She notes an uncle who also has had an MI in his 7s and her grandfather has had heart problems.  The patient smokes tobacco.  Past Medical History:  Diagnosis Date  . Asthma   . Hypercholesterolemia     There are no active problems to display for this patient.   Past Surgical  History:  Procedure Laterality Date  . CESAREAN SECTION    . TONSILLECTOMY       OB History   No obstetric history on file.      Home Medications    Prior to Admission medications   Medication Sig Start Date End Date Taking? Authorizing Provider  albuterol (PROVENTIL HFA;VENTOLIN HFA) 108 (90 BASE) MCG/ACT inhaler Inhale 2 puffs into the lungs every 6 (six) hours as needed for wheezing or shortness of breath.    [provider]  ALPRAZolam Prudy Feeler) 1 MG tablet Take 1 mg by mouth every 6 (six) hours as needed for anxiety.    [provider]  oxyCODONE-acetaminophen (PERCOCET/ROXICET) 5-325 MG per tablet Take 1 tablet by mouth 3 (three) times daily as needed for pain.    [provider]    Family History No family history on file.  Social History Social History   Tobacco Use  . Smoking status: Current Every Day Smoker    Packs/day: 0.50  . Smokeless tobacco: Never Used  Substance Use Topics  . Alcohol use: No  . Drug use: No     Allergies   Patient has no known allergies.   Review of Systems Review of Systems  Constitutional: Positive for diaphoresis. Negative for fever.  HENT: Negative for ear pain and sore throat.  Eyes: Negative for visual disturbance.  Respiratory: Positive for cough and shortness of breath.   Cardiovascular: Positive for chest pain. Negative for palpitations and leg swelling.  Gastrointestinal: Negative for abdominal pain, constipation, diarrhea, nausea and vomiting.  Genitourinary: Negative for flank pain.  Musculoskeletal: Negative for back pain.  Skin: Negative for color change and rash.  Neurological: Negative for dizziness, weakness, light-headedness and numbness.  All other systems reviewed and are negative.    Physical Exam Updated Vital Signs BP 137/70 (BP Location: Right Arm)   Pulse 84   Temp 98 F (36.7 C) (Oral)   Resp 20   Ht 5\' 5"  (1.651 m)   Wt 93 kg   SpO2 99%   BMI 34.11 kg/m    Physical Exam Vitals signs and nursing note reviewed.  Constitutional:      General: She is not in acute distress.    Appearance: She is well-developed. She is not ill-appearing or diaphoretic.  HENT:     Head: Normocephalic and atraumatic.  Eyes:     Conjunctiva/sclera: Conjunctivae normal.  Neck:     Musculoskeletal: Neck supple.  Cardiovascular:     Rate and Rhythm: Normal rate and regular rhythm.     Heart sounds: No murmur.  Pulmonary:     Effort: Pulmonary effort is normal. No respiratory distress.     Breath sounds: No decreased breath sounds, wheezing, rhonchi or rales.  Chest:     Chest wall: No tenderness.     Comments: No rashes Abdominal:     General: Bowel sounds are normal.     Palpations: Abdomen is soft.     Tenderness: There is no abdominal tenderness. There is no guarding or rebound.  Musculoskeletal:     Right lower leg: She exhibits no tenderness. No edema.     Left lower leg: She exhibits no tenderness. No edema.  Skin:    General: Skin is warm and dry.  Neurological:     Mental Status: She is alert.  Psychiatric:        Mood and Affect: Mood normal.      ED Treatments / Results  Labs (all labs ordered are listed, but only abnormal results are displayed) Labs Reviewed  CBC WITH DIFFERENTIAL/PLATELET - Abnormal; Notable for the following components:      Result Value   WBC 19.9 (*)    Hemoglobin 15.2 (*)    All other components within normal limits  COMPREHENSIVE METABOLIC PANEL  D-DIMER, QUANTITATIVE (NOT AT [facility])  I-STAT TROPONIN, ED  I-STAT BETA HCG BLOOD, ED ([facility], [facility], [facility] ONLY)    EKG EKG Interpretation  Date/Time:  Friday September 21 2018 13:10:10 EST Ventricular Rate:  84 PR Interval:  158 QRS Duration: 82 QT Interval:  394 QTC Calculation: 465 R Axis:   81 Text Interpretation:  Normal sinus rhythm Normal ECG Confirmed by Kennis Carina [phone]) on 09/21/2018 1:53:37 PM   Radiology No results found.  Procedures Procedures  (including critical care time)  Medications Ordered in ED Medications  aspirin chewable tablet 324 mg (324 mg Oral Given 09/21/18 1327)     Initial Impression / Assessment and Plan / ED Course  I have reviewed the triage vital signs and the nursing notes.  Pertinent labs & imaging results that were available during my care of the patient were reviewed by me and considered in my medical decision making (see chart for details).     Final Clinical Impressions(s) / ED Diagnoses   Final diagnoses:  None   Patient is a 33 year old female with 2 day h/o chest pain and shortness of breath.  CP feels like a stabbing, intermittent pain to the left side of the chest. Radiated across chest and to the back today. Sxs last seconds to minutes and resolve on their own. Pain not associated with exertion. Pain associated with SOB, diaphoresis, and mild cough. On arrival to the ED, sxs have resolved. No risk factors for PE. No physical exam findings to suggest DVT. Has strong fam hx of cardiac disease. Smokes tobacco. Denies HTN or DM.   ASA given.  CBC with leukocytosis at 19. No anemia. CMP WNL. Beta hcg negative Trop negative Ddimer negative  EKG with NSR. No ischemic changes. No STEMI.  CXR and delta trop are still pending at the time of shift change.  Discussed case with Dr. Pilar PlateBero who evaluated the pt. Pt care was signed out to him pending CXR and delta troponin.  ED Discharge Orders    None       Karrie MeresCouture, Pelham Hennick S, [place]-C 09/21/18 1456    Sabas SousBero, Michael M, MD 09/22/18 1250

## 2020-11-21 IMAGING — DX DG CHEST 2V
2 series · 2 of 2 positions shown · non-contrast
Comparison: May 15, 2013.

CLINICAL DATA: Chest pain

EXAM:
CHEST - 2 VIEW

[chest pa]
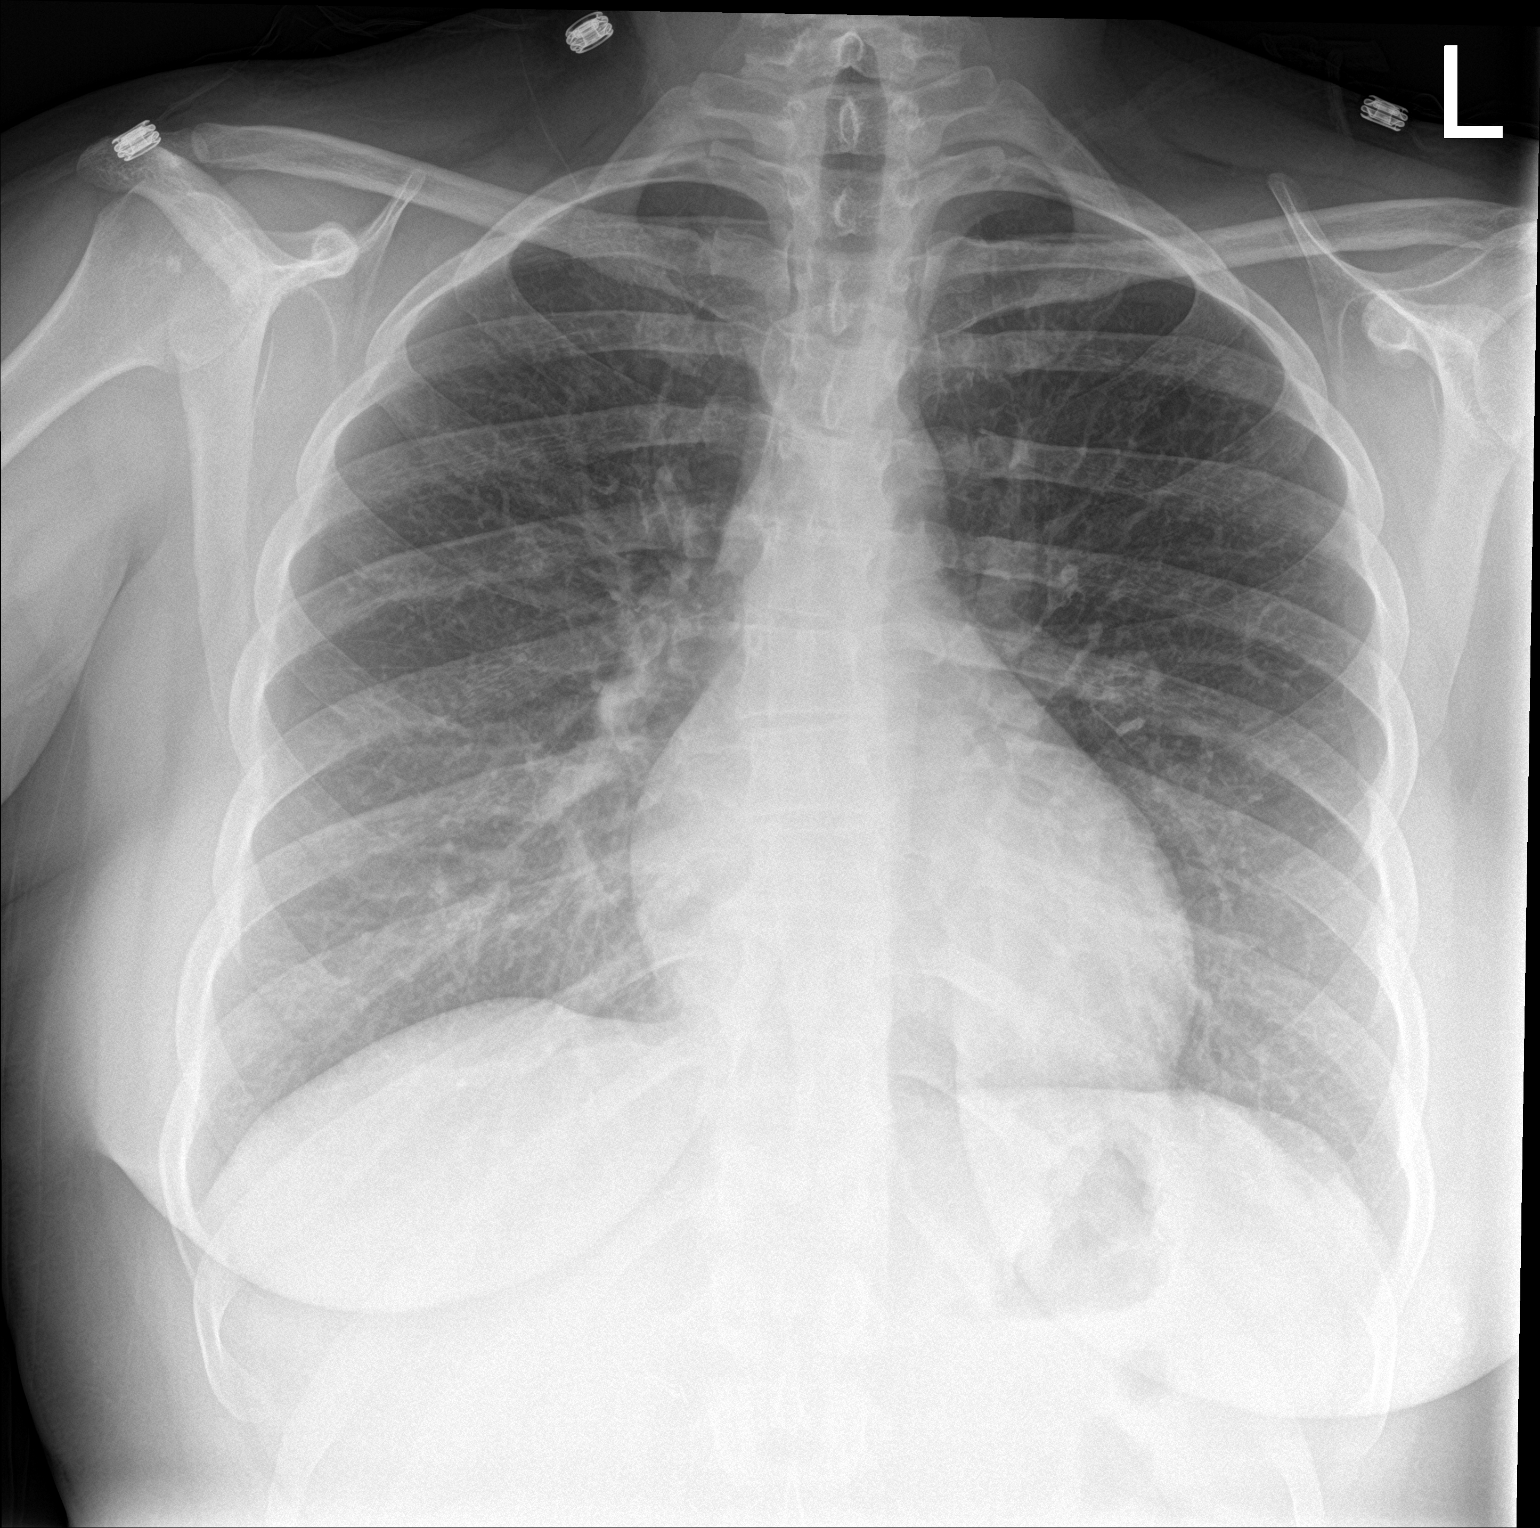

[chest lat]
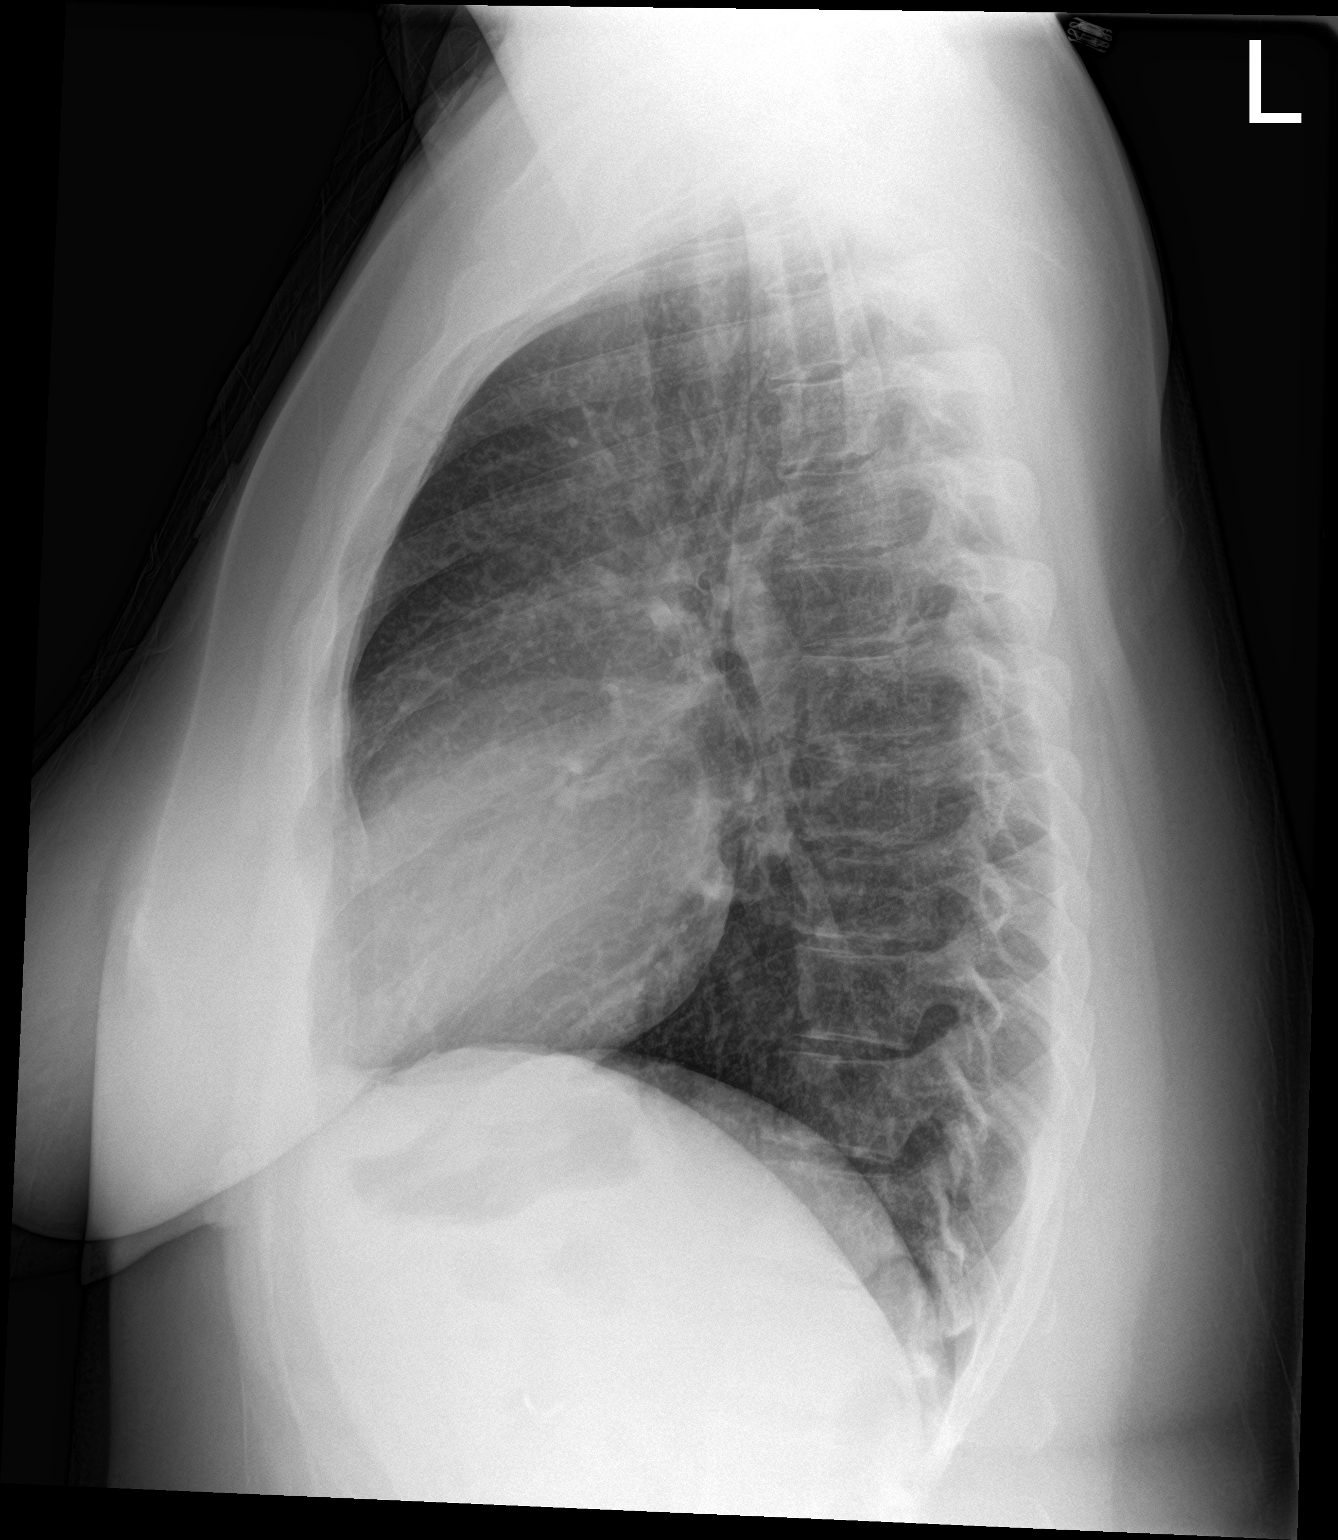

[2 of 2 positions shown; findings below may reference images not displayed]

FINDINGS: Lungs are clear. Heart size and pulmonary vascularity are normal. No
adenopathy. No bone lesions. There is slight midthoracic
dextroscoliosis.
IMPRESSION: No edema or consolidation.

## 2021-08-30 DIAGNOSIS — F419 Anxiety disorder, unspecified: Secondary | ICD-10-CM | POA: Diagnosis not present

## 2021-08-30 DIAGNOSIS — J309 Allergic rhinitis, unspecified: Secondary | ICD-10-CM | POA: Diagnosis not present

## 2021-11-15 DIAGNOSIS — J40 Bronchitis, not specified as acute or chronic: Secondary | ICD-10-CM | POA: Diagnosis not present

## 2021-11-29 DIAGNOSIS — J309 Allergic rhinitis, unspecified: Secondary | ICD-10-CM | POA: Diagnosis not present

## 2021-11-29 DIAGNOSIS — F419 Anxiety disorder, unspecified: Secondary | ICD-10-CM | POA: Diagnosis not present

## 2022-01-05 DIAGNOSIS — S6992XA Unspecified injury of left wrist, hand and finger(s), initial encounter: Secondary | ICD-10-CM | POA: Diagnosis not present

## 2022-01-05 DIAGNOSIS — M25532 Pain in left wrist: Secondary | ICD-10-CM | POA: Diagnosis not present

## 2022-01-05 DIAGNOSIS — Z6831 Body mass index (BMI) 31.0-31.9, adult: Secondary | ICD-10-CM | POA: Diagnosis not present

## 2022-01-05 DIAGNOSIS — M79642 Pain in left hand: Secondary | ICD-10-CM | POA: Diagnosis not present

## 2022-01-05 DIAGNOSIS — S60222A Contusion of left hand, initial encounter: Secondary | ICD-10-CM | POA: Diagnosis not present

## 2022-06-21 DIAGNOSIS — F419 Anxiety disorder, unspecified: Secondary | ICD-10-CM | POA: Diagnosis not present

## 2022-07-04 DIAGNOSIS — R300 Dysuria: Secondary | ICD-10-CM | POA: Diagnosis not present

## 2022-10-25 ENCOUNTER — Encounter: Payer: Self-pay | Admitting: Nurse Practitioner

## 2022-10-25 ENCOUNTER — Ambulatory Visit: Payer: BC Managed Care – PPO | Admitting: Nurse Practitioner

## 2022-10-25 VITALS — BP 122/80 | HR 92 | Temp 98.6°F | Resp 20 | Ht 65.0 in | Wt 205.0 lb

## 2022-10-25 DIAGNOSIS — J4520 Mild intermittent asthma, uncomplicated: Secondary | ICD-10-CM

## 2022-10-25 DIAGNOSIS — F411 Generalized anxiety disorder: Secondary | ICD-10-CM | POA: Diagnosis not present

## 2022-10-25 DIAGNOSIS — Z79891 Long term (current) use of opiate analgesic: Secondary | ICD-10-CM | POA: Diagnosis not present

## 2022-10-25 MED ORDER — ALBUTEROL SULFATE HFA 108 (90 BASE) MCG/ACT IN AERS: 2.0000 | INHALATION_SPRAY | Freq: Four times a day (QID) | RESPIRATORY_TRACT | 1 refills | Status: DC | PRN

## 2022-10-25 MED ORDER — ALPRAZOLAM 1 MG PO TABS: 1.0000 mg | ORAL_TABLET | Freq: Four times a day (QID) | ORAL | 5 refills | Status: DC | PRN

## 2022-10-25 NOTE — Progress Notes (Addendum)
Subjective:    Patient ID: Mia Rodgers, female    DOB: [birth year], 37 y.o.   MRN: [id number]   Chief Complaint: Establish Care    HPI:  Mia Rodgers is a 37 y.o. who identifies as a female who was assigned female at birth.   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 She has been on this for many years.    10/25/2022    8:13 AM  GAD 7 : Generalized Anxiety Score  Nervous, Anxious, on Edge 1  Control/stop worrying 1  Worry too much - different things 1  Trouble relaxing 1  Restless 0  Easily annoyed or irritable 1  Afraid - awful might happen 0  Total GAD 7 Score 5  Anxiety Difficulty Somewhat difficult    2. Asthma Has occasional flareups. Keeps albuterol available when needed. Smokes at least 1/2 pack a day  There are no diagnoses linked to this encounter.  Patient's BMI is >30 mg/m2.  Patient's current BMI is Body mass index is 34.11 kg/m.Mia Rodgers  Patient is currently enrolled in a healthy eating plan along with encouraged exercise.  Patient has contraindications to phentermine, Contrave & Qsymia (contains phentermine).  Patient does not have a personal or family history of medullary thyroid carcinoma (MTC) or Multiple Endocrine Neoplasia syndrome type 2 (MEN 2).   New complaints: None today  Allergies  Allergen Reactions   Hydrocodone    Outpatient Encounter Medications as of 10/25/2022  Medication Sig   albuterol (PROVENTIL HFA;VENTOLIN HFA) 108 (90 BASE) MCG/ACT inhaler Inhale 2 puffs into the lungs every 6 (six) hours as needed for wheezing or shortness of breath.   ALPRAZolam (XANAX) 1 MG tablet Take 1 mg by mouth every 6 (six) hours as needed for anxiety.   cetirizine (ZYRTEC) 10 MG tablet Take 10 mg by mouth daily.   [DISCONTINUED] oxyCODONE-acetaminophen (PERCOCET/ROXICET) 5-325 MG per tablet  Take 1 tablet by mouth 3 (three) times daily as needed for pain.   No facility-administered encounter medications on file as of 10/25/2022.    Past Surgical History:  Procedure Laterality Date   CESAREAN SECTION     TONSILLECTOMY      History reviewed. No pertinent family history.    Controlled substance contract: 10/25/22     Review of Systems  Constitutional:  Negative for diaphoresis.  Eyes:  Negative for pain.  Respiratory:  Negative for shortness of breath.   Cardiovascular:  Negative for chest pain, palpitations and leg swelling.  Gastrointestinal:  Negative for abdominal pain.  Endocrine: Negative for polydipsia.  Skin:  Negative for rash.  Neurological:  Negative for dizziness, weakness and headaches.  Hematological:  Does not bruise/bleed easily.  All other systems reviewed and are negative.      Objective:   Physical Exam Vitals and nursing note reviewed.  Constitutional:      General: She is not in acute distress.    Appearance: Normal appearance. She is well-developed.  HENT:     Head: Normocephalic.     Right Ear: Tympanic membrane normal.     Left Ear: Tympanic membrane normal.     Nose: Nose normal.     Mouth/Throat:     Mouth: Mucous membranes are moist.  Eyes:     Pupils: Pupils are equal, round, and reactive to light.  Neck:  Vascular: No carotid bruit or JVD.  Cardiovascular:     Rate and Rhythm: Normal rate and regular rhythm.     Heart sounds: Normal heart sounds.  Pulmonary:     Effort: Pulmonary effort is normal. No respiratory distress.     Breath sounds: Normal breath sounds. No wheezing or rales.  Chest:     Chest wall: No tenderness.  Abdominal:     General: Bowel sounds are normal. There is no distension or abdominal bruit.     Palpations: Abdomen is soft. There is no hepatomegaly, splenomegaly, mass or pulsatile mass.     Tenderness: There is no abdominal tenderness.  Musculoskeletal:        General: Normal range of motion.      Cervical back: Normal range of motion and neck supple.  Lymphadenopathy:     Cervical: No cervical adenopathy.  Skin:    General: Skin is warm and dry.  Neurological:     Mental Status: She is alert and oriented to person, place, and time.     Deep Tendon Reflexes: Reflexes are normal and symmetric.  Psychiatric:        Behavior: Behavior normal.        Thought Content: Thought content normal.        Judgment: Judgment normal.    BP 122/80   Pulse 92   Temp 98.6 F (37 C) (Temporal)   Resp 20   Ht 5' 5"$  (1.651 m)   Wt 205 lb (93 kg)   SpO2 98%   BMI 34.11 kg/m         Assessment & Plan:  Mia Rodgers in today with chief complaint of Establish Care   1. GAD (generalized anxiety disorder) Stress management - ALPRAZolam (XANAX) 1 MG tablet; Take 1 tablet (1 mg total) by mouth every 6 (six) hours as needed for anxiety.  Dispense: 60 tablet; Refill: 5 - Drug Screen 10 W/Conf, Serum  2. Mild intermittent asthma without complication Smoking cessation encouraged - albuterol (VENTOLIN HFA) 108 (90 Base) MCG/ACT inhaler; Inhale 2 puffs into the lungs every 6 (six) hours as needed for wheezing or shortness of breath.  Dispense: 18 g; Refill: 1  3. Obesity Zepbound 2.10m weekly  The above assessment and management plan was discussed with the patient. The patient verbalized understanding of and has agreed to the management plan. Patient is aware to call the clinic if symptoms persist or worsen. Patient is aware when to return to the clinic for a follow-up visit. Patient educated on when it is appropriate to go to the emergency department.   Mary-Margaret MHassell Done FNP

## 2022-10-25 NOTE — Addendum Note (Signed)
Addended by: Rolena Infante on: 10/25/2022 08:49 AM   Modules accepted: Orders

## 2022-10-25 NOTE — Patient Instructions (Signed)

## 2022-10-27 LAB — TOXASSURE SELECT 13 (MW), URINE

## 2022-11-01 ENCOUNTER — Other Ambulatory Visit: Payer: Self-pay | Admitting: Nurse Practitioner

## 2022-11-01 MED ORDER — ZEPBOUND 2.5 MG/0.5ML ~~LOC~~ SOAJ: 2.5000 mg | SUBCUTANEOUS | 3 refills | Status: DC

## 2022-11-01 NOTE — Addendum Note (Signed)
Addended by: Chevis Pretty on: 11/01/2022 04:12 PM   Modules accepted: Orders

## 2022-11-15 ENCOUNTER — Ambulatory Visit: Payer: Self-pay | Admitting: Nurse Practitioner

## 2023-01-04 ENCOUNTER — Telehealth: Payer: Self-pay | Admitting: Nurse Practitioner

## 2023-01-04 ENCOUNTER — Telehealth: Payer: Self-pay

## 2023-01-04 NOTE — Telephone Encounter (Signed)
Pt needs PA on tirzepatide (ZEPBOUND) 2.5 MG/0.5ML Pen. Use the Drug store. Pt wants a follow up call on this when ready.

## 2023-01-04 NOTE — Telephone Encounter (Signed)
Mia Rodgers (Key: [id number]) Rx #: [id number] Need Help? Call us at [phone] Status sent iconSent to Plan today Drug Zepbound 2.5MG /0.5ML pen-injectors ePA cloud logo Form [employer] [place] Commercial Electronic Request Form

## 2023-01-10 NOTE — Telephone Encounter (Signed)
Mia Rodgers (Key: [id number]) Need Help? Call us at [phone] Status sent iconSent to Plan today Drug Zepbound 2.5MG /0.5ML pen-injectors ePA cloud logo Form [employer] [place] Commercial Electronic Request Form

## 2023-01-10 NOTE — Telephone Encounter (Signed)
Calling to check on PA for Zepbound 2.5 mg

## 2023-01-10 NOTE — Telephone Encounter (Signed)
Patient aware that prior Berkley Harvey is still waiting on answer from insurance company

## 2023-01-26 ENCOUNTER — Telehealth: Payer: Self-pay

## 2023-01-26 NOTE — Telephone Encounter (Signed)
Not that I am aware of.

## 2023-01-26 NOTE — Telephone Encounter (Signed)
PA request received via CMM for Zepbound 2.5MG /0.5ML pen-injectors  PA submitted and is pending additional questions/determination  Key: [id number]  *Patient's BMI is >30 mg/m2.  Patient's current BMI is Body mass index is 34.11 kg/m.Mia Rodgers  Patient is currently enrolled in a healthy eating plan along with encouraged exercise.  Patient has contraindications to phentermine, Contrave & Qsymia (contains phentermine).  Patient does not have a personal or family history of medullary thyroid carcinoma (MTC) or Multiple Endocrine Neoplasia syndrome type 2 (MEN 2).

## 2023-01-26 NOTE — Telephone Encounter (Signed)
New encounter created for request

## 2023-01-26 NOTE — Telephone Encounter (Signed)
PA questions generated. Does patient have trial/failure or contraindication to [facility] or Saxenda?  Please advise if we want to switch to a formulary preferred or with clinical rationale.  Key: [id number]

## 2023-01-26 NOTE — Telephone Encounter (Signed)
Previous request cancelled out Key: [facility]  Request submitted and pending additional questions/determination in Key: [id number]

## 2023-01-27 ENCOUNTER — Other Ambulatory Visit: Payer: Self-pay | Admitting: Nurse Practitioner

## 2023-01-27 DIAGNOSIS — J4520 Mild intermittent asthma, uncomplicated: Secondary | ICD-10-CM

## 2023-01-27 MED ORDER — WEGOVY 0.25 MG/0.5ML ~~LOC~~ SOAJ: 0.2500 mg | SUBCUTANEOUS | 2 refills | Status: DC

## 2023-01-27 NOTE — Addendum Note (Signed)
Addended by: Bennie Pierini on: 01/27/2023 05:00 PM   Modules accepted: Orders

## 2023-01-27 NOTE — Telephone Encounter (Signed)
Please send in wegovy.

## 2023-01-27 NOTE — Telephone Encounter (Signed)
Please tell patient that insurance will not pay for zepbound- would she like to do wegovy that is weekly or san=xenda which is daily. Still not sure insurance will cover either one of these though

## 2023-02-16 ENCOUNTER — Telehealth: Payer: Self-pay

## 2023-02-16 NOTE — Telephone Encounter (Signed)
Pharmacy Patient Advocate Encounter   Received notification from [facility] that prior authorization for [facility] 0.25MG /0.5ML auto-injectors is required/requested.   PA initiated to [facility] via CoverMyMeds Key or (Medicaid) confirmation # [id number]  Waiting for questions

## 2023-02-16 NOTE — Telephone Encounter (Signed)
Please use [facility] system or use [email or web address] to process wegovy PA

## 2023-02-17 NOTE — Telephone Encounter (Signed)
Questions answered.  PA submitted. 

## 2023-02-20 ENCOUNTER — Other Ambulatory Visit (HOSPITAL_COMMUNITY): Payer: Self-pay

## 2023-02-20 NOTE — Telephone Encounter (Signed)
Patient Advocate Encounter  Prior Authorization for [employer] 0.25MG /0.5ML auto-injectors has been [place] with [place].    Effective dates: 02/17/23 through 06/23/23  Per [facility] test claim, copay for 28 days supply is $24.99 (with eVoucher)

## 2023-03-20 ENCOUNTER — Telehealth: Payer: Self-pay | Admitting: Nurse Practitioner

## 2023-03-21 MED ORDER — IBUPROFEN 800 MG PO TABS: 800.0000 mg | ORAL_TABLET | Freq: Three times a day (TID) | ORAL | 0 refills | Status: DC | PRN

## 2023-03-21 NOTE — Telephone Encounter (Signed)
Meds ordered this encounter  Medications   ibuprofen (ADVIL) 800 MG tablet    Sig: Take 1 tablet (800 mg total) by mouth every 8 (eight) hours as needed.    Dispense:  30 tablet    Refill:  0    Order Specific Question:   Supervising Provider    Answer:   Nils Pyle [[id number]]   [name], FNP

## 2023-03-21 NOTE — Telephone Encounter (Signed)
Motrin sent to pharmacy

## 2023-03-21 NOTE — Telephone Encounter (Signed)
Left detailed message per DPR  

## 2023-04-05 ENCOUNTER — Other Ambulatory Visit: Payer: Self-pay | Admitting: Nurse Practitioner

## 2023-04-05 DIAGNOSIS — F411 Generalized anxiety disorder: Secondary | ICD-10-CM

## 2023-04-05 MED ORDER — ALPRAZOLAM 1 MG PO TABS: 1.0000 mg | ORAL_TABLET | Freq: Four times a day (QID) | ORAL | 0 refills | Status: DC | PRN

## 2023-04-05 NOTE — Telephone Encounter (Signed)
  Prescription Request  04/05/2023  Is this a "Controlled Substance" medicine?   Have you seen your PCP in the last 2 weeks? No, LOV 10/25/22 NOV 8/20 and added to waitlist   If YES, route message to pool  -  If NO, patient needs to be scheduled for appointment.  What is the name of the medication or equipment?  ALPRAZolam Prudy Feeler) 1 MG tablet   Have you contacted your pharmacy to request a refill? yes   Which pharmacy would you like this sent to?  [facility] - [place], [place] - [address] (Ph: [phone])     Patient notified that their request is being sent to the clinical staff for review and that they should receive a response within 2 business days.

## 2023-04-26 ENCOUNTER — Other Ambulatory Visit: Payer: Self-pay | Admitting: Nurse Practitioner

## 2023-04-26 DIAGNOSIS — F411 Generalized anxiety disorder: Secondary | ICD-10-CM

## 2023-05-02 ENCOUNTER — Ambulatory Visit: Payer: BC Managed Care – PPO | Admitting: Nurse Practitioner

## 2023-05-02 ENCOUNTER — Encounter: Payer: Self-pay | Admitting: Nurse Practitioner

## 2023-05-02 VITALS — BP 111/75 | HR 69 | Temp 98.1°F | Resp 20 | Ht 65.0 in | Wt 214.0 lb

## 2023-05-02 DIAGNOSIS — Z136 Encounter for screening for cardiovascular disorders: Secondary | ICD-10-CM | POA: Diagnosis not present

## 2023-05-02 DIAGNOSIS — Z6835 Body mass index (BMI) 35.0-35.9, adult: Secondary | ICD-10-CM

## 2023-05-02 DIAGNOSIS — F411 Generalized anxiety disorder: Secondary | ICD-10-CM

## 2023-05-02 NOTE — Patient Instructions (Signed)
Exercising to Stay Healthy To become healthy and stay healthy, it is recommended that you do moderate-intensity and vigorous-intensity exercise. You can tell that you are exercising at a moderate intensity if your heart starts beating faster and you start breathing faster but can still hold a conversation. You can tell that you are exercising at a vigorous intensity if you are breathing much harder and faster and cannot hold a conversation while exercising. How can exercise benefit me? Exercising regularly is important. It has many health benefits, such as: Improving overall fitness, flexibility, and endurance. Increasing bone density. Helping with weight control. Decreasing body fat. Increasing muscle strength and endurance. Reducing stress and tension, anxiety, depression, or anger. Improving overall health. What guidelines should I follow while exercising? Before you start a new exercise program, talk with your health care provider. Do not exercise so much that you hurt yourself, feel dizzy, or get very short of breath. Wear comfortable clothes and wear shoes with good support. Drink plenty of water while you exercise to prevent dehydration or heat stroke. Work out until your breathing and your heartbeat get faster (moderate intensity). How often should I exercise? Choose an activity that you enjoy, and set realistic goals. Your health care provider can help you make an activity plan that is individually designed and works best for you. Exercise regularly as told by your health care provider. This may include: Doing strength training two times a week, such as: Lifting weights. Using resistance bands. Push-ups. Sit-ups. Yoga. Doing a certain intensity of exercise for a given amount of time. Choose from these options: A total of 150 minutes of moderate-intensity exercise every week. A total of 75 minutes of vigorous-intensity exercise every week. A mix of moderate-intensity and  vigorous-intensity exercise every week. Children, pregnant women, people who have not exercised regularly, people who are overweight, and older adults may need to talk with a health care provider about what activities are safe to perform. If you have a medical condition, be sure to talk with your health care provider before you start a new exercise program. What are some exercise ideas? Moderate-intensity exercise ideas include: Walking 1 mile (1.6 km) in about 15 minutes. Biking. Hiking. Golfing. Dancing. Water aerobics. Vigorous-intensity exercise ideas include: Walking 4.5 miles (7.2 km) or more in about 1 hour. Jogging or running 5 miles (8 km) in about 1 hour. Biking 10 miles (16.1 km) or more in about 1 hour. Lap swimming. Roller-skating or in-line skating. Cross-country skiing. Vigorous competitive sports, such as football, basketball, and soccer. Jumping rope. Aerobic dancing. What are some everyday activities that can help me get exercise? Yard work, such as: Pushing a lawn mower. Raking and bagging leaves. Washing your car. Pushing a stroller. Shoveling snow. Gardening. Washing windows or floors. How can I be more active in my day-to-day activities? Use stairs instead of an elevator. Take a walk during your lunch break. If you drive, park your car farther away from your work or school. If you take public transportation, get off one stop early and walk the rest of the way. Stand up or walk around during all of your indoor phone calls. Get up, stretch, and walk around every 30 minutes throughout the day. Enjoy exercise with a friend. Support to continue exercising will help you keep a regular routine of activity. Where to find more information You can find more information about exercising to stay healthy from: U.S. Department of Health and Human Services: www.hhs.gov Centers for Disease Control and Prevention (  CDC): www.cdc.gov Summary Exercising regularly is  important. It will improve your overall fitness, flexibility, and endurance. Regular exercise will also improve your overall health. It can help you control your weight, reduce stress, and improve your bone density. Do not exercise so much that you hurt yourself, feel dizzy, or get very short of breath. Before you start a new exercise program, talk with your health care provider. This information is not intended to replace advice given to you by your health care provider. Make sure you discuss any questions you have with your health care provider. Document Revised: 12/25/2020 Document Reviewed: 12/25/2020 Elsevier Patient Education  2024 Elsevier Inc.  

## 2023-05-02 NOTE — Progress Notes (Signed)
Subjective:    Patient ID: Mia Rodgers, female    DOB: 14-Jan-1986, 37 y.o.   MRN: [id number]   Chief Complaint: medical management of chronic issues     HPI:  Mia Rodgers is a 37 y.o. who identifies as a female who was assigned female at birth.   Social history: Lives with: husband and 2 sons Work history: [employer]   Comes in today for follow up of the following chronic medical issues:  1. GAD (generalized anxiety disorder) Has xanax to take as needed. She does not take everyday    05/02/2023   12:02 PM 10/25/2022    8:13 AM  GAD 7 : Generalized Anxiety Score  Nervous, Anxious, on Edge 1 1  Control/stop worrying 1 1  Worry too much - different things 1 1  Trouble relaxing 1 1  Restless 1 0  Easily annoyed or irritable 1 1  Afraid - awful might happen 1 0  Total GAD 7 Score 7 5  Anxiety Difficulty Somewhat difficult Somewhat difficult      2. BMI 35.0-35.9,adult Weight is up 9 lbs Wt Readings from Last 3 Encounters:  05/02/23 214 lb (97.1 kg)  10/25/22 205 lb (93 kg)  09/21/18 205 lb (93 kg)   BMI Readings from Last 3 Encounters:  05/02/23 35.61 kg/m  10/25/22 34.11 kg/m  09/21/18 34.11 kg/m    New complaints: Had blood work done at work and had some.   Allergies  Allergen Reactions   Hydrocodone    Outpatient Encounter Medications as of 05/02/2023  Medication Sig   albuterol (VENTOLIN HFA) 108 (90 Base) MCG/ACT inhaler USE 2 PUFFS EVERY 6 HOURS AS NEEDED   ALPRAZolam (XANAX) 1 MG tablet Take 1 tablet (1 mg total) by mouth every 6 (six) hours as needed for anxiety.   cetirizine (ZYRTEC) 10 MG tablet Take 10 mg by mouth daily.   ibuprofen (ADVIL) 800 MG tablet Take 1 tablet (800 mg total) by mouth every 8 (eight) hours as needed.   Semaglutide-Weight Management (WEGOVY) 0.25 MG/0.5ML SOAJ Inject 0.25 mg into the skin once a week.   tirzepatide (ZEPBOUND) 2.5 MG/0.5ML Pen Inject 2.5 mg into the skin once a week.   No facility-administered  encounter medications on file as of 05/02/2023.    Past Surgical History:  Procedure Laterality Date   CESAREAN SECTION     TONSILLECTOMY      History reviewed. No pertinent family history.    Controlled substance contract: n/a      Review of Systems  Constitutional:  Negative for diaphoresis.  Eyes:  Negative for pain.  Respiratory:  Negative for shortness of breath.   Cardiovascular:  Negative for chest pain, palpitations and leg swelling.  Gastrointestinal:  Negative for abdominal pain.  Endocrine: Negative for polydipsia.  Skin:  Negative for rash.  Neurological:  Negative for dizziness, weakness and headaches.  Hematological:  Does not bruise/bleed easily.  All other systems reviewed and are negative.      Objective:   Physical Exam Vitals and nursing note reviewed.  Constitutional:      General: She is not in acute distress.    Appearance: Normal appearance. She is well-developed.  HENT:     Head: Normocephalic.     Right Ear: Tympanic membrane normal.     Left Ear: Tympanic membrane normal.     Nose: Nose normal.     Mouth/Throat:     Mouth: Mucous membranes are moist.  Eyes:  Pupils: Pupils are equal, round, and reactive to light.  Neck:     Vascular: No carotid bruit or JVD.  Cardiovascular:     Rate and Rhythm: Normal rate and regular rhythm.     Heart sounds: Normal heart sounds.  Pulmonary:     Effort: Pulmonary effort is normal. No respiratory distress.     Breath sounds: Normal breath sounds. No wheezing or rales.  Chest:     Chest wall: No tenderness.  Abdominal:     General: Bowel sounds are normal. There is no distension or abdominal bruit.     Palpations: Abdomen is soft. There is no hepatomegaly, splenomegaly, mass or pulsatile mass.     Tenderness: There is no abdominal tenderness.  Musculoskeletal:        General: Normal range of motion.     Cervical back: Normal range of motion and neck supple.  Lymphadenopathy:     Cervical:  No cervical adenopathy.  Skin:    General: Skin is warm and dry.  Neurological:     Mental Status: She is alert and oriented to person, place, and time.     Deep Tendon Reflexes: Reflexes are normal and symmetric.  Psychiatric:        Behavior: Behavior normal.        Thought Content: Thought content normal.        Judgment: Judgment normal.    BP 111/75   Pulse 69   Temp 98.1 F (36.7 C) (Temporal)   Resp 20   Ht 5\' 5"  (1.651 m)   Wt 214 lb (97.1 kg)   SpO2 98%   BMI 35.61 kg/m         Assessment & Plan:   Mia Rodgers comes in today with chief complaint of Medical Management of Chronic Issues   Diagnosis and orders addressed:  1. GAD (generalized anxiety disorder) Stress ,management - Lipid panel  2. BMI 35.0-35.9,adult Discussed diet and exercise for person with BMI >25 Will recheck weight in 3-6 months  Reviewed labs from work- repeated some labs  Labs pending Health Maintenance reviewed Diet and exercise encouraged  Follow up plan: 6 months   Mia Daphine Deutscher, FNP

## 2023-05-03 LAB — BMP8+EGFR
BUN/Creatinine Ratio: 10 (ref 9–23)
BUN: 7 mg/dL (ref 6–20)
CO2: 24 mmol/L (ref 20–29)
Calcium: 9.4 mg/dL (ref 8.7–10.2)
Chloride: 105 mmol/L (ref 96–106)
Creatinine, Ser: 0.7 mg/dL (ref 0.57–1.00)
Glucose: 83 mg/dL (ref 70–99)
Potassium: 3.8 mmol/L (ref 3.5–5.2)
Sodium: 142 mmol/L (ref 134–144)
eGFR: 114 mL/min/{1.73_m2} (ref >59)

## 2023-05-03 LAB — LIPID PANEL
Chol/HDL Ratio: 8 ratio — ABNORMAL HIGH (ref 0.0–4.4)
Cholesterol, Total: 247 mg/dL — ABNORMAL HIGH (ref 100–199)
HDL: 31 mg/dL — ABNORMAL LOW (ref >39)
LDL Chol Calc (NIH): 158 mg/dL — ABNORMAL HIGH (ref 0–99)
Triglycerides: 307 mg/dL — ABNORMAL HIGH (ref 0–149)
VLDL Cholesterol Cal: 58 mg/dL — ABNORMAL HIGH (ref 5–40)

## 2023-05-04 NOTE — Telephone Encounter (Signed)
Best diet to be on is weight watchers. You get a large variety of things on weight watchers and dont have to limit yourself as much.

## 2023-05-22 ENCOUNTER — Other Ambulatory Visit: Payer: Self-pay | Admitting: Nurse Practitioner

## 2023-05-22 DIAGNOSIS — F411 Generalized anxiety disorder: Secondary | ICD-10-CM

## 2023-05-23 ENCOUNTER — Other Ambulatory Visit: Payer: Self-pay | Admitting: Nurse Practitioner

## 2023-05-23 DIAGNOSIS — F411 Generalized anxiety disorder: Secondary | ICD-10-CM

## 2023-05-23 MED ORDER — ALPRAZOLAM 1 MG PO TABS
1.0000 mg | ORAL_TABLET | Freq: Four times a day (QID) | ORAL | 0 refills | Status: DC | PRN
Start: 2023-05-23 — End: 2023-06-20

## 2023-05-23 MED ORDER — IBUPROFEN 800 MG PO TABS: 800.0000 mg | ORAL_TABLET | Freq: Three times a day (TID) | ORAL | 0 refills | Status: DC | PRN

## 2023-06-20 ENCOUNTER — Other Ambulatory Visit: Payer: Self-pay | Admitting: Nurse Practitioner

## 2023-06-20 DIAGNOSIS — F411 Generalized anxiety disorder: Secondary | ICD-10-CM

## 2023-06-20 MED ORDER — IBUPROFEN 800 MG PO TABS: 800.0000 mg | ORAL_TABLET | Freq: Three times a day (TID) | ORAL | 0 refills | Status: DC | PRN

## 2023-06-20 MED ORDER — ALPRAZOLAM 1 MG PO TABS
1.0000 mg | ORAL_TABLET | Freq: Two times a day (BID) | ORAL | 0 refills | Status: DC
Start: 2023-06-20 — End: 2023-07-25

## 2023-06-20 NOTE — Progress Notes (Signed)
Meds ordered this encounter  Medications   ibuprofen (ADVIL) 800 MG tablet    Sig: Take 1 tablet (800 mg total) by mouth every 8 (eight) hours as needed.    Dispense:  30 tablet    Refill:  0    Order Specific Question:   Supervising Provider    Answer:   Nils Pyle [[id number]]   [name], FNP

## 2023-07-07 ENCOUNTER — Other Ambulatory Visit: Payer: Self-pay | Admitting: Nurse Practitioner

## 2023-07-07 DIAGNOSIS — J4520 Mild intermittent asthma, uncomplicated: Secondary | ICD-10-CM

## 2023-07-25 ENCOUNTER — Telehealth: Payer: Self-pay | Admitting: Family Medicine

## 2023-07-25 DIAGNOSIS — F411 Generalized anxiety disorder: Secondary | ICD-10-CM

## 2023-07-25 MED ORDER — ALPRAZOLAM 1 MG PO TABS
1.0000 mg | ORAL_TABLET | Freq: Two times a day (BID) | ORAL | 0 refills | Status: DC
Start: 2023-07-25 — End: 2023-08-24

## 2023-07-25 NOTE — Addendum Note (Signed)
Addended by: Bennie Pierini on: 07/25/2023 01:14 PM   Modules accepted: Orders

## 2023-07-25 NOTE — Telephone Encounter (Signed)
Copied from CRM [id number]. Topic: Clinical - Medication Refill >> Jul 24, 2023  4:48 PM Cassiday T wrote: Most Recent Primary Care Visit:  Provider: Bennie Pierini  Department: [facility]  Visit Type: OFFICE VISIT  Date: 05/02/2023  Medication: ALPRAZolam Prudy Feeler) 1 MG table patient also tried to refill the medication through mychart on nov 7th and it is not showing  up in the system like the refill request went through she want's to make sure that it would not mess her refill request up for next month   Has the patient contacted their pharmacy? Yes (Agent: If no, request that the patient contact the pharmacy for the refill. If patient does not wish to contact the pharmacy document the reason why and proceed with request.) (Agent: If yes, when and what did the pharmacy advise?)  Is this the correct pharmacy for this prescription? Yes If no, delete pharmacy and type the correct one.  This is the patient's preferred pharmacy:  [facility] [place] - [address] [place] [address] Phone: [phone] Fax: [phone]   Has the prescription been filled recently? No  Is the patient out of the medication? Yes  Has the patient been seen for an appointment in the last year OR does the patient have an upcoming appointment? Yes  Can we respond through MyChart? No  Agent: Please be advised that Rx refills may take up to 3 business days. We ask that you follow-up with your pharmacy.

## 2023-07-25 NOTE — Telephone Encounter (Signed)
Please review and advise on refill.

## 2023-08-23 ENCOUNTER — Telehealth: Payer: Self-pay | Admitting: *Deleted

## 2023-08-23 NOTE — Telephone Encounter (Signed)
Pt aware per last mychart message she ntbs for refills on her xanax so scheduled her with PCP tomorrow at 12:30.

## 2023-08-23 NOTE — Telephone Encounter (Signed)
Copied from CRM [id number]. Topic: MyChart - Other >> Aug 23, 2023  3:21 PM Joanette Gula wrote: Reason for CRM: Pt is having an issue with refilling hern ALPRAZolam (XANAX) 1 MG tablet & ibuprofen (ADVIL) 800 MG tablet... Pt states that she tries to do the refill request on her own thru My Chart.. but it never gets to [place].. Admin please assist with this issue. Thanks

## 2023-08-24 ENCOUNTER — Other Ambulatory Visit: Payer: Self-pay | Admitting: Nurse Practitioner

## 2023-08-24 ENCOUNTER — Ambulatory Visit: Payer: BC Managed Care – PPO | Admitting: Nurse Practitioner

## 2023-08-24 VITALS — BP 118/73 | HR 78 | Temp 97.8°F | Ht 65.0 in | Wt 214.0 lb

## 2023-08-24 DIAGNOSIS — E669 Obesity, unspecified: Secondary | ICD-10-CM

## 2023-08-24 DIAGNOSIS — Z6835 Body mass index (BMI) 35.0-35.9, adult: Secondary | ICD-10-CM | POA: Diagnosis not present

## 2023-08-24 DIAGNOSIS — F411 Generalized anxiety disorder: Secondary | ICD-10-CM

## 2023-08-24 MED ORDER — WEGOVY 0.25 MG/0.5ML ~~LOC~~ SOAJ
0.2500 mg | SUBCUTANEOUS | 3 refills | Status: DC
Start: 2023-08-24 — End: 2023-11-21

## 2023-08-24 MED ORDER — IBUPROFEN 800 MG PO TABS: 800.0000 mg | ORAL_TABLET | Freq: Three times a day (TID) | ORAL | 0 refills | Status: DC | PRN

## 2023-08-24 MED ORDER — ALPRAZOLAM 1 MG PO TABS
1.0000 mg | ORAL_TABLET | Freq: Two times a day (BID) | ORAL | 1 refills | Status: DC
Start: 2023-08-24 — End: 2023-10-27

## 2023-08-24 NOTE — Progress Notes (Signed)
Meds ordered this encounter  Medications   ibuprofen (ADVIL) 800 MG tablet    Sig: Take 1 tablet (800 mg total) by mouth every 8 (eight) hours as needed.    Dispense:  30 tablet    Refill:  0    Supervising Provider:   Arville Care A [[id number]]   [name], FNP

## 2023-08-24 NOTE — Patient Instructions (Signed)
 Semaglutide Injection (Weight Management) What is this medication? SEMAGLUTIDE (SEM a GLOO tide) promotes weight loss. It may also be used to maintain weight loss.  It works by decreasing appetite. It can be used to lower the risk of heart attack and stroke in people affected by excess weight. Changes to diet and exercise are often combined with this medication. This medicine may be used for other purposes; ask your health care provider or pharmacist if you have questions. COMMON BRAND NAME(S): GNFAOZ What should I tell my care team before I take this medication? They need to know if you have any of these conditions: Diabetes Eye disease caused by diabetes History of depression or other mental health conditions Kidney disease Pancreatic disease Personal or family history of MEN 2, a condition that causes endocrine gland tumors Personal or family history of thyroid cancer Suicidal thoughts, plans, or attempt by you or a family member An unusual or allergic reaction to semaglutide, other medications, foods, dyes, or preservatives Pregnant or trying to get pregnant Breastfeeding How should I use this medication? This medication is injected under the skin. You will be taught how to prepare and give it. Take it as directed on the prescription label. It is given once every week (every 7 days). Keep taking it unless your care team tells you to stop. It is important that you put your used needles and pens in a special sharps container. Do not put them in a trash can. If you do not have a sharps container, call your pharmacist or care team to get one. A special MedGuide will be given to you by the pharmacist with each prescription and refill. Be sure to read this information carefully each time. This medication comes with INSTRUCTIONS FOR USE. Ask your pharmacist for directions on how to use this medication. Read the information carefully. Talk to your pharmacist or care team if you have  questions. Talk to your care team about the use of this medication in children. While it may be prescribed for children as young as 12 years for selected conditions, precautions do apply. Overdosage: If you think you have taken too much of this medicine contact a poison control center or emergency room at once. NOTE: This medicine is only for you. Do not share this medicine with others. What if I miss a dose? If you miss a dose and the next scheduled dose is more than 2 days away, take the missed dose as soon as possible. If you miss a dose and the next scheduled dose is less than 2 days away, do not take the missed dose. Take the next dose at your regular time. Do not take double or extra doses. If you miss your dose for 2 weeks or more, take the next dose at your regular time or call your care team to talk about how to restart this medication. What may interact with this medication? Insulin and other medications for diabetes This list may not describe all possible interactions. Give your health care provider a list of all the medicines, herbs, non-prescription drugs, or dietary supplements you use. Also tell them if you smoke, drink alcohol, or use illegal drugs. Some items may interact with your medicine. What should I watch for while using this medication? Visit your care team for regular checks on your progress. It may be some time before you see the benefit from this medication. Drink plenty of fluids while taking this medication. Check with your care team if you have severe  diarrhea, nausea, and vomiting, or if you sweat a lot. The loss of too much body fluid may make it dangerous for you to take this medication. This medication may affect blood sugar levels. Ask your care team if changes in diet or medications are needed if you have diabetes. Talk to your care team if you may be pregnant. Losing weight while pregnant is not advised and may cause harm to the fetus. Talk to your care team for more  information. What side effects may I notice from receiving this medication? Side effects that you should report to your care team as soon as possible: Allergic reactions--skin rash, itching, hives, swelling of the face, lips, tongue, or throat Change in vision Dehydration--increased thirst, dry mouth, feeling faint or lightheaded, headache, dark yellow or brown urine Gallbladder problems--severe stomach pain, nausea, vomiting, fever Heart palpitations--rapid, pounding, or irregular heartbeat Kidney injury--decrease in the amount of urine, swelling of the ankles, hands, or feet Pancreatitis--severe stomach pain that spreads to your back or gets worse after eating or when touched, fever, nausea, vomiting Thoughts of suicide or self-harm, worsening mood, feelings of depression Thyroid cancer--new mass or lump in the neck, pain or trouble swallowing, trouble breathing, hoarseness Side effects that usually do not require medical attention (report these to your care team if they continue or are bothersome): Diarrhea Loss of appetite Nausea Upset stomach This list may not describe all possible side effects. Call your doctor for medical advice about side effects. You may report side effects to FDA at 1-800-FDA-1088. Where should I keep my medication? Keep out of the reach of children and pets. Refrigeration (preferred): Store in the refrigerator. Do not freeze. Keep this medication in the original container until you are ready to take it. Get rid of any unused medication after the expiration date. Room temperature: If needed, prior to cap removal, the pen can be stored at room temperature for up to 28 days. Protect from light. If it is stored at room temperature, get rid of any unused medication after 28 days or after it expires, whichever is first. It is important to get rid of the medication as soon as you no longer need it or it is expired. You can do this in two ways: Take the medication to a  medication take-back program. Check with your pharmacy or law enforcement to find a location. If you cannot return the medication, follow the directions in the MedGuide. NOTE: This sheet is a summary. It may not cover all possible information. If you have questions about this medicine, talk to your doctor, pharmacist, or health care provider.  2024 Elsevier/Gold Standard (2022-11-30 00:00:00)

## 2023-08-24 NOTE — Progress Notes (Signed)
Subjective:    Patient ID: Mia Rodgers, female    DOB: 1986/07/20, 37 y.o.   MRN: [id number]   Chief Complaint: anxiety  HPI  Patient comes in for refill of xanax. She is having issues with her kids and stays anxious all the time.    08/24/2023   12:24 PM 05/02/2023   12:02 PM 10/25/2022    8:13 AM  GAD 7 : Generalized Anxiety Score  Nervous, Anxious, on Edge 2 1 1   Control/stop worrying 2 1 1   Worry too much - different things 2 1 1   Trouble relaxing 2 1 1   Restless 1 1 0  Easily annoyed or irritable 2 1 1   Afraid - awful might happen 0 1 0  Total GAD 7 Score 11 7 5   Anxiety Difficulty Somewhat difficult Somewhat difficult Somewhat difficult   Wants to try wegovy for weight loss. Insurance is suppose to cover it now so she would like prescription sent in.   Patient Active Problem List   Diagnosis Date Noted   GAD (generalized anxiety disorder) 05/02/2023       Review of Systems  Constitutional:  Negative for diaphoresis.  Eyes:  Negative for pain.  Respiratory:  Negative for shortness of breath.   Cardiovascular:  Negative for chest pain, palpitations and leg swelling.  Gastrointestinal:  Negative for abdominal pain.  Endocrine: Negative for polydipsia.  Skin:  Negative for rash.  Neurological:  Negative for dizziness, weakness and headaches.  Hematological:  Does not bruise/bleed easily.  All other systems reviewed and are negative.      Objective:   Physical Exam Vitals and nursing note reviewed.  Constitutional:      General: She is not in acute distress.    Appearance: Normal appearance. She is well-developed.  Neck:     Vascular: No carotid bruit or JVD.  Cardiovascular:     Rate and Rhythm: Normal rate and regular rhythm.     Heart sounds: Normal heart sounds.  Pulmonary:     Effort: Pulmonary effort is normal. No respiratory distress.     Breath sounds: Normal breath sounds. No wheezing or rales.  Chest:     Chest wall: No tenderness.   Abdominal:     General: Bowel sounds are normal. There is no distension or abdominal bruit.     Palpations: Abdomen is soft. There is no hepatomegaly, splenomegaly, mass or pulsatile mass.     Tenderness: There is no abdominal tenderness.  Musculoskeletal:        General: Normal range of motion.     Cervical back: Normal range of motion and neck supple.  Lymphadenopathy:     Cervical: No cervical adenopathy.  Skin:    General: Skin is warm and dry.  Neurological:     Mental Status: She is alert and oriented to person, place, and time.     Deep Tendon Reflexes: Reflexes are normal and symmetric.  Psychiatric:        Behavior: Behavior normal.        Thought Content: Thought content normal.        Judgment: Judgment normal.    BP 118/73   Pulse 78   Temp 97.8 F (36.6 C) (Temporal)   Ht 5\' 5"  (1.651 m)   Wt 214 lb (97.1 kg)   SpO2 96%   BMI 35.61 kg/m         Assessment & Plan:   Mia Rodgers in today with chief complaint of Medical  Management of Chronic Issues   1. GAD (generalized anxiety disorder) Stress management - ALPRAZolam (XANAX) 1 MG tablet; Take 1 tablet (1 mg total) by mouth 2 (two) times daily.  Dispense: 60 tablet; Refill: 1  2. BMI 35.0-35.9,adult (Primary) Discussed diet and exercise for person with BMI >25 Will recheck weight in 3-6 months  - Semaglutide-Weight Management (WEGOVY) 0.25 MG/0.5ML SOAJ; Inject 0.25 mg into the skin once a week.  Dispense: 2 mL; Refill: 3    The above assessment and management plan was discussed with the patient. The patient verbalized understanding of and has agreed to the management plan. Patient is aware to call the clinic if symptoms persist or worsen. Patient is aware when to return to the clinic for a follow-up visit. Patient educated on when it is appropriate to go to the emergency department.   [name], FNP

## 2023-09-18 ENCOUNTER — Other Ambulatory Visit (HOSPITAL_COMMUNITY): Payer: Self-pay

## 2023-10-27 ENCOUNTER — Other Ambulatory Visit: Payer: Self-pay | Admitting: Nurse Practitioner

## 2023-10-27 DIAGNOSIS — F411 Generalized anxiety disorder: Secondary | ICD-10-CM

## 2023-11-07 ENCOUNTER — Ambulatory Visit (INDEPENDENT_AMBULATORY_CARE_PROVIDER_SITE_OTHER): Payer: Self-pay | Admitting: Family Medicine

## 2023-11-07 ENCOUNTER — Ambulatory Visit: Payer: Self-pay | Admitting: Nurse Practitioner

## 2023-11-07 ENCOUNTER — Encounter: Payer: Self-pay | Admitting: Family Medicine

## 2023-11-07 VITALS — BP 103/68 | HR 105 | Temp 97.8°F | Wt 209.2 lb

## 2023-11-07 DIAGNOSIS — R112 Nausea with vomiting, unspecified: Secondary | ICD-10-CM

## 2023-11-07 DIAGNOSIS — R197 Diarrhea, unspecified: Secondary | ICD-10-CM

## 2023-11-07 DIAGNOSIS — K529 Noninfective gastroenteritis and colitis, unspecified: Secondary | ICD-10-CM

## 2023-11-07 MED ORDER — ONDANSETRON HCL 4 MG PO TABS: 4.0000 mg | ORAL_TABLET | Freq: Three times a day (TID) | ORAL | 0 refills | Status: AC | PRN

## 2023-11-07 NOTE — Telephone Encounter (Signed)
 Appt today

## 2023-11-07 NOTE — Telephone Encounter (Signed)
   Chief Complaint: vomiting Symptoms: vomiting, diarrhea, chills, fatigue Frequency: since yesterday Pertinent Negatives: Patient denies fever, sob, blood in vomit/diarrhea Disposition: [] ED /[] Urgent Care (no appt availability in office) / [x] Appointment(In office/virtual)/ []  [facility]/ [] Home Care/ [] Refused Recommended Disposition /[] [facility]/ []  Follow-up with PCP Additional Notes: Patient reports that she has had multiple episodes of vomiting and diarrhea since yesterday. Patient denies any episodes of vomiting today and states she was able to keep her ginger ale down this morning. Patient reports she is also experienecing chills and fatigue, as well as "soreness" in her abdomen.  Patient denies sick contacts and states she believes she may have food poisoning but is unsure. Per protocol, appt scheduled in office today 2/25. Patient advised to call back with worsening symptoms. Patient verbalized understanding.       Copied From CRM [id number]. Reason for Triage: Pt has been vomiting and having diarrhea since yesterday. Feels weak  Best contact: [phone]   Reason for Disposition  [1] MILD or MODERATE vomiting AND [2] present > 48 hours (2 days) (Exception: Mild vomiting with associated diarrhea.)  Answer Assessment - Initial Assessment Questions 1. VOMITING SEVERITY: "How many times have you vomited in the past 24 hours?"     - MILD:  1 - 2 times/day    - MODERATE: 3 - 5 times/day, decreased oral intake without significant weight loss or symptoms of dehydration    - SEVERE: 6 or more times/day, vomits everything or nearly everything, with significant weight loss, symptoms of dehydration      Yesterday more than 5, none today 2. ONSET: "When did the vomiting begin?"      yesterday 3. FLUIDS: "What fluids or food have you vomited up today?" "Have you been able to keep any fluids down?"     I have not tried to eat today, but I can keep down ginger ale  today 4. ABDOMEN PAIN: "Are your having any abdomen pain?" If Yes : "How bad is it and what does it feel like?" (e.g., crampy, dull, intermittent, constant)      My stomach feels sore 5. DIARRHEA: "Is there any diarrhea?" If Yes, ask: "How many times today?"      Yes, more than 5 6. CONTACTS: "Is there anyone else in the family with the same symptoms?"      none 7. CAUSE: "What do you think is causing your vomiting?"     "I'm not sure" 8. HYDRATION STATUS: "Any signs of dehydration?" (e.g., dry mouth [not only dry lips], too weak to stand) "When did you last urinate?"     Dry mouth 9. OTHER SYMPTOMS: "Do you have any other symptoms?" (e.g., fever, headache, vertigo, vomiting blood or coffee grounds, recent head injury)     Diarrhea, chills, weak  Protocols used: Vomiting-A-AH

## 2023-11-07 NOTE — Patient Instructions (Signed)
BRAT diet

## 2023-11-07 NOTE — Progress Notes (Signed)
 Subjective:  Patient ID: Mia Rodgers, female    DOB: Nov 29, 1985, 38 y.o.   MRN: [id number]  Patient Care Team: Bennie Pierini, FNP as PCP - General (Family Medicine)   Chief Complaint:  Vomiting (Was only yesterday ) and Diarrhea (X 2 day)   HPI: Mia Rodgers is a 38 y.o. female presenting on 11/07/2023 for Vomiting (Was only yesterday ) and Diarrhea (X 2 day)   Discussed the use of AI scribe software for clinical note transcription with the patient, who gave verbal consent to proceed.  History of Present Illness   Mia Rodgers is a 38 year old female who presents with acute onset of gastrointestinal symptoms including vomiting and diarrhea.  She experienced an acute onset of gastrointestinal symptoms, including vomiting and diarrhea, beginning around 10:00 to 10:30 AM yesterday. The symptoms progressively worsened throughout the day, with vomiting described as 'projectile' and occurring through both the nose and mouth. While the vomiting has resolved, diarrhea persists.  She has not eaten since the onset of symptoms, with her last meal consisting of a mandarin orange and an egg white for breakfast yesterday morning. She has not been around anyone who is sick and has not recently used antibiotics or been hospitalized.  She feels weak and suspects dehydration, although she is urinating normally. No blood in stool and the diarrhea is watery. She has not experienced fever and is concerned about not spreading the illness to her husband.          Relevant past medical, surgical, family, and social history reviewed and updated as indicated.  Allergies and medications reviewed and updated. Data reviewed: Chart in Epic.   Past Medical History:  Diagnosis Date   Allergies    Asthma    Hypercholesterolemia     Past Surgical History:  Procedure Laterality Date   CESAREAN SECTION     TONSILLECTOMY      Social History   Socioeconomic History   Marital status:  Married    Spouse name: Not on file   Number of children: Not on file   Years of education: Not on file   Highest education level: GED or equivalent  Occupational History   Not on file  Tobacco Use   Smoking status: Every Day    Current packs/day: 0.50    Types: Cigarettes   Smokeless tobacco: Never  Substance and Sexual Activity   Alcohol use: No   Drug use: No   Sexual activity: Yes    Birth control/protection: Surgical  Other Topics Concern   Not on file  Social History Narrative   Not on file   Social Drivers of Health   Financial Resource Strain: Low Risk  (08/24/2023)   Overall Financial Resource Strain (CARDIA)    Difficulty of Paying Living Expenses: Not hard at all  Food Insecurity: No Food Insecurity (08/24/2023)   Hunger Vital Sign    Worried About Running Out of Food in the Last Year: Never true    Ran Out of Food in the Last Year: Never true  Transportation Needs: No Transportation Needs (08/24/2023)   PRAPARE - Administrator, Civil Service (Medical): No    Lack of Transportation (Non-Medical): No  Physical Activity: Sufficiently Active (08/24/2023)   Exercise Vital Sign    Days of Exercise per Week: 5 days    Minutes of Exercise per Session: 30 min  Stress: Stress Concern Present (08/24/2023)   [employer] of Occupational Health -  Occupational Stress Questionnaire    Feeling of Stress : Rather much  Social Connections: Moderately Integrated (08/24/2023)   Social Connection and Isolation Panel [NHANES]    Frequency of Communication with Friends and Family: More than three times a week    Frequency of Social Gatherings with Friends and Family: Twice a week    Attends Religious Services: 1 to 4 times per year    Active Member of [employer] or Organizations: No    Attends Banker Meetings: Not on file    Marital Status: Married  Intimate Partner Violence: Not on file    Outpatient Encounter Medications as of 11/07/2023   Medication Sig   albuterol (VENTOLIN HFA) 108 (90 Base) MCG/ACT inhaler USE 2 PUFFS EVERY 6 HOURS AS NEEDED   ALPRAZolam (XANAX) 1 MG tablet TAKE ONE (1) TABLET BY MOUTH TWO (2) TIMES DAILY   cetirizine (ZYRTEC) 10 MG tablet Take 10 mg by mouth daily.   ibuprofen (ADVIL) 800 MG tablet Take 1 tablet (800 mg total) by mouth every 8 (eight) hours as needed.   ondansetron (ZOFRAN) 4 MG tablet Take 1 tablet (4 mg total) by mouth every 8 (eight) hours as needed for nausea or vomiting.   Semaglutide-Weight Management (WEGOVY) 0.25 MG/0.5ML SOAJ Inject 0.25 mg into the skin once a week. (Patient not taking: Reported on 11/07/2023)   No facility-administered encounter medications on file as of 11/07/2023.    Allergies  Allergen Reactions   Hydrocodone    Hydrocodone-Acetaminophen Nausea And Vomiting    Pertinent ROS per HPI, otherwise unremarkable      Objective:  BP 103/68   Pulse (!) 105   Temp 97.8 F (36.6 C)   Wt 209 lb 3.2 oz (94.9 kg)   LMP 11/07/2023   SpO2 96%   BMI 34.81 kg/m    Wt Readings from Last 3 Encounters:  11/07/23 209 lb 3.2 oz (94.9 kg)  08/24/23 214 lb (97.1 kg)  05/02/23 214 lb (97.1 kg)    Physical Exam Vitals and nursing note reviewed.  Constitutional:      Appearance: Normal appearance. She is obese.  HENT:     Head: Normocephalic and atraumatic.     Nose: Nose normal.     Mouth/Throat:     Mouth: Mucous membranes are moist.  Eyes:     Conjunctiva/sclera: Conjunctivae normal.     Pupils: Pupils are equal, round, and reactive to light.  Cardiovascular:     Rate and Rhythm: Normal rate and regular rhythm.     Heart sounds: Normal heart sounds.  Pulmonary:     Effort: Pulmonary effort is normal.     Breath sounds: Normal breath sounds.  Abdominal:     General: Bowel sounds are normal.     Palpations: Abdomen is soft.     Tenderness: There is no abdominal tenderness.  Musculoskeletal:     Cervical back: Neck supple.  Skin:    General: Skin  is warm and dry.     Capillary Refill: Capillary refill takes less than 2 seconds.  Neurological:     General: No focal deficit present.     Mental Status: She is alert and oriented to person, place, and time.  Psychiatric:        Mood and Affect: Mood normal.        Behavior: Behavior normal.        Thought Content: Thought content normal.        Judgment: Judgment normal.  Results for orders placed or performed in visit on 05/02/23  Lipid panel   Collection Time: 05/02/23 12:22 PM  Result Value Ref Range   Cholesterol, Total 247 (H) 100 - 199 mg/dL   Triglycerides 621 (H) 0 - 149 mg/dL   HDL 31 (L) >30 mg/dL   VLDL Cholesterol Cal 58 (H) 5 - 40 mg/dL   LDL Chol Calc (NIH) 865 (H) 0 - 99 mg/dL   Chol/HDL Ratio 8.0 (H) 0.0 - 4.4 ratio  BMP8+EGFR   Collection Time: 05/02/23 12:22 PM  Result Value Ref Range   Glucose 83 70 - 99 mg/dL   BUN 7 6 - 20 mg/dL   Creatinine, Ser 7.84 0.57 - 1.00 mg/dL   eGFR 696 >29 BM/WUX/3.24   BUN/Creatinine Ratio 10 9 - 23   Sodium 142 134 - 144 mmol/L   Potassium 3.8 3.5 - 5.2 mmol/L   Chloride 105 96 - 106 mmol/L   CO2 24 20 - 29 mmol/L   Calcium 9.4 8.7 - 10.2 mg/dL       Pertinent labs & imaging results that were available during my care of the patient were reviewed by me and considered in my medical decision making.  Assessment & Plan:  Elzada was seen today for vomiting and diarrhea.  Diagnoses and all orders for this visit:  Nausea and vomiting in adult -     ondansetron (ZOFRAN) 4 MG tablet; Take 1 tablet (4 mg total) by mouth every 8 (eight) hours as needed for nausea or vomiting.  Diarrhea in adult patient -     ondansetron (ZOFRAN) 4 MG tablet; Take 1 tablet (4 mg total) by mouth every 8 (eight) hours as needed for nausea or vomiting.  Gastroenteritis -     ondansetron (ZOFRAN) 4 MG tablet; Take 1 tablet (4 mg total) by mouth every 8 (eight) hours as needed for nausea or vomiting.     Assessment and Plan    Viral  Gastroenteritis Acute onset of vomiting, diarrhea, and chills starting yesterday. Vomiting resolved, diarrhea persists. No recent antibiotic use or hospitalizations. Likely viral etiology, possibly influenza A or another stomach virus. No blood in stool, no fever, and remains hydrated with normal urination. Discussed hydration and BRAT diet. Explained diarrhea should not be stopped unless it persists beyond 5-7 days. Emphasized hand hygiene and avoiding sharing food/drinks to prevent transmission. Advised changing toothbrush to avoid reinfection. - Recommend BRAT diet (bananas, rice, applesauce, toast). - Advise staying hydrated with water and adding Gatorade or Pedialyte once a day. - Prescribe Zofran for nausea and vomiting as needed. - Monitor diarrhea; contact clinic if it persists beyond 5-7 days. - Emphasize good hand hygiene and avoiding sharing food/drinks. - Change toothbrush to avoid reinfection. - Call clinic if symptoms worsen or new symptoms develop.          Continue all other maintenance medications.  Follow up plan: Return if symptoms worsen or fail to improve.   Continue healthy lifestyle choices, including diet (rich in fruits, vegetables, and lean proteins, and low in salt and simple carbohydrates) and exercise (at least 30 minutes of moderate physical activity daily).  Educational handout given for gastroenteritis   The above assessment and management plan was discussed with the patient. The patient verbalized understanding of and has agreed to the management plan. Patient is aware to call the clinic if they develop any new symptoms or if symptoms persist or worsen. Patient is aware when to return to the clinic for a  follow-up visit. Patient educated on when it is appropriate to go to the emergency department.   Kari Baars, FNP-C [facility] [phone]

## 2023-11-21 ENCOUNTER — Ambulatory Visit: Admitting: Nurse Practitioner

## 2023-11-21 ENCOUNTER — Other Ambulatory Visit

## 2023-11-21 ENCOUNTER — Encounter: Payer: Self-pay | Admitting: Nurse Practitioner

## 2023-11-21 ENCOUNTER — Ambulatory Visit: Payer: BC Managed Care – PPO | Admitting: Nurse Practitioner

## 2023-11-21 ENCOUNTER — Other Ambulatory Visit: Payer: Self-pay

## 2023-11-21 VITALS — BP 124/73 | HR 95 | Temp 98.2°F | Ht 65.0 in | Wt 216.0 lb

## 2023-11-21 DIAGNOSIS — Z6835 Body mass index (BMI) 35.0-35.9, adult: Secondary | ICD-10-CM | POA: Diagnosis not present

## 2023-11-21 DIAGNOSIS — F411 Generalized anxiety disorder: Secondary | ICD-10-CM

## 2023-11-21 MED ORDER — WEGOVY 0.25 MG/0.5ML ~~LOC~~ SOAJ: 0.2500 mg | SUBCUTANEOUS | 3 refills | Status: DC

## 2023-11-21 MED ORDER — ALPRAZOLAM 1 MG PO TABS: 1.0000 mg | ORAL_TABLET | Freq: Two times a day (BID) | ORAL | 5 refills | Status: DC

## 2023-11-21 NOTE — Patient Instructions (Signed)

## 2023-11-21 NOTE — Progress Notes (Signed)
 Subjective:    Patient ID: Mia Rodgers, female    DOB: 1986-06-05, 38 y.o.   MRN: [id number]   Chief Complaint: medical management of chronic issues     HPI:  Mia Rodgers is a 38 y.o. who identifies as a female who was assigned female at birth.   Social history: Lives with: husband and 2 sons Work history: [employer]   Comes in today for follow up of the following chronic medical issues:  1. GAD (generalized anxiety disorder) Has xanax to take as needed. She does not take everyday    08/24/2023   12:24 PM 05/02/2023   12:02 PM 10/25/2022    8:13 AM  GAD 7 : Generalized Anxiety Score  Nervous, Anxious, on Edge 2 1 1   Control/stop worrying 2 1 1   Worry too much - different things 2 1 1   Trouble relaxing 2 1 1   Restless 1 1 0  Easily annoyed or irritable 2 1 1   Afraid - awful might happen 0 1 0  Total GAD 7 Score 11 7 5   Anxiety Difficulty Somewhat difficult Somewhat difficult Somewhat difficult        2. BMI 35.0-35.9,adult Weight is up 7lbs. She was prescribed and was approved but she never picked it up. She says she never knew as approved. Wt Readings from Last 3 Encounters:  11/21/23 216 lb (98 kg)  11/07/23 209 lb 3.2 oz (94.9 kg)  08/24/23 214 lb (97.1 kg)   BMI Readings from Last 3 Encounters:  11/21/23 35.94 kg/m  11/07/23 34.81 kg/m  08/24/23 35.61 kg/m    New complaints: Had blood work done at work and had some.   Allergies  Allergen Reactions   Hydrocodone    Hydrocodone-Acetaminophen Nausea And Vomiting   Outpatient Encounter Medications as of 11/21/2023  Medication Sig   albuterol (VENTOLIN HFA) 108 (90 Base) MCG/ACT inhaler USE 2 PUFFS EVERY 6 HOURS AS NEEDED   ALPRAZolam (XANAX) 1 MG tablet TAKE ONE (1) TABLET BY MOUTH TWO (2) TIMES DAILY   cetirizine (ZYRTEC) 10 MG tablet Take 10 mg by mouth daily.   ibuprofen (ADVIL) 800 MG tablet Take 1 tablet (800 mg total) by mouth every 8 (eight) hours as needed.   ondansetron (ZOFRAN) 4  MG tablet Take 1 tablet (4 mg total) by mouth every 8 (eight) hours as needed for nausea or vomiting.   Semaglutide-Weight Management (WEGOVY) 0.25 MG/0.5ML SOAJ Inject 0.25 mg into the skin once a week. (Patient not taking: Reported on 11/07/2023)   No facility-administered encounter medications on file as of 11/21/2023.    Past Surgical History:  Procedure Laterality Date   CESAREAN SECTION     TONSILLECTOMY      No family history on file.    Controlled substance contract: n/a      Review of Systems  Constitutional:  Negative for diaphoresis.  Eyes:  Negative for pain.  Respiratory:  Negative for shortness of breath.   Cardiovascular:  Negative for chest pain, palpitations and leg swelling.  Gastrointestinal:  Negative for abdominal pain.  Endocrine: Negative for polydipsia.  Skin:  Negative for rash.  Neurological:  Negative for dizziness, weakness and headaches.  Hematological:  Does not bruise/bleed easily.  All other systems reviewed and are negative.      Objective:   Physical Exam Vitals and nursing note reviewed.  Constitutional:      General: She is not in acute distress.    Appearance: Normal appearance. She is well-developed.  HENT:     Head: Normocephalic.     Right Ear: Tympanic membrane normal.     Left Ear: Tympanic membrane normal.     Nose: Nose normal.     Mouth/Throat:     Mouth: Mucous membranes are moist.  Eyes:     Pupils: Pupils are equal, round, and reactive to light.  Neck:     Vascular: No carotid bruit or JVD.  Cardiovascular:     Rate and Rhythm: Normal rate and regular rhythm.     Heart sounds: Normal heart sounds.  Pulmonary:     Effort: Pulmonary effort is normal. No respiratory distress.     Breath sounds: Normal breath sounds. No wheezing or rales.  Chest:     Chest wall: No tenderness.  Abdominal:     General: Bowel sounds are normal. There is no distension or abdominal bruit.     Palpations: Abdomen is soft. There is no  hepatomegaly, splenomegaly, mass or pulsatile mass.     Tenderness: There is no abdominal tenderness.  Musculoskeletal:        General: Normal range of motion.     Cervical back: Normal range of motion and neck supple.  Lymphadenopathy:     Cervical: No cervical adenopathy.  Skin:    General: Skin is warm and dry.  Neurological:     Mental Status: She is alert and oriented to person, place, and time.     Deep Tendon Reflexes: Reflexes are normal and symmetric.  Psychiatric:        Behavior: Behavior normal.        Thought Content: Thought content normal.        Judgment: Judgment normal.    BP 124/73   Pulse 95   Temp 98.2 F (36.8 C) (Temporal)   Ht 5\' 5"  (1.651 m)   Wt 216 lb (98 kg)   LMP 11/07/2023   SpO2 97%   BMI 35.94 kg/m          Assessment & Plan:   Mia Rodgers in today with chief complaint of Medical Management of Chronic Issues   1. GAD (generalized anxiety disorder) (Primary) Stress management - ALPRAZolam (XANAX) 1 MG tablet; Take 1 tablet (1 mg total) by mouth 2 (two) times daily.  Dispense: 60 tablet; Refill: 5  2. BMI 35.0-35.9,adult Discussed diet and exercise for person with BMI >25 Will recheck weight in 3-6 months Will try  to get wegovy filled again - Semaglutide-Weight Management (WEGOVY) 0.25 MG/0.5ML SOAJ; Inject 0.25 mg into the skin once a week.  Dispense: 2 mL; Refill: 3    The above assessment and management plan was discussed with the patient. The patient verbalized understanding of and has agreed to the management plan. Patient is aware to call the clinic if symptoms persist or worsen. Patient is aware when to return to the clinic for a follow-up visit. Patient educated on when it is appropriate to go to the emergency department.   Mia Daphine Deutscher, FNP

## 2023-11-22 LAB — CMP14+EGFR
ALT: 15 IU/L (ref 0–32)
AST: 15 IU/L (ref 0–40)
Albumin: 4.5 g/dL (ref 3.9–4.9)
Alkaline Phosphatase: 66 IU/L (ref 44–121)
BUN/Creatinine Ratio: 14 (ref 9–23)
BUN: 9 mg/dL (ref 6–20)
Bilirubin Total: <0.2 mg/dL (ref 0.0–1.2)
CO2: 23 mmol/L (ref 20–29)
Calcium: 9.7 mg/dL (ref 8.7–10.2)
Chloride: 99 mmol/L (ref 96–106)
Creatinine, Ser: 0.66 mg/dL (ref 0.57–1.00)
Globulin, Total: 2.2 g/dL (ref 1.5–4.5)
Glucose: 73 mg/dL (ref 70–99)
Potassium: 4.1 mmol/L (ref 3.5–5.2)
Sodium: 139 mmol/L (ref 134–144)
Total Protein: 6.7 g/dL (ref 6.0–8.5)
eGFR: 115 mL/min/{1.73_m2} (ref >59)

## 2023-11-22 LAB — CBC WITH DIFFERENTIAL/PLATELET
Basophils Absolute: 0.1 10*3/uL (ref 0.0–0.2)
Basos: 0 %
EOS (ABSOLUTE): 0.6 10*3/uL — ABNORMAL HIGH (ref 0.0–0.4)
Eos: 4 %
Hematocrit: 45.2 % (ref 34.0–46.6)
Hemoglobin: 14.9 g/dL (ref 11.1–15.9)
Immature Grans (Abs): 0.1 10*3/uL (ref 0.0–0.1)
Immature Granulocytes: 1 %
Lymphocytes Absolute: 4.9 10*3/uL — ABNORMAL HIGH (ref 0.7–3.1)
Lymphs: 30 %
MCH: 31 pg (ref 26.6–33.0)
MCHC: 33 g/dL (ref 31.5–35.7)
MCV: 94 fL (ref 79–97)
Monocytes Absolute: 1.1 10*3/uL — ABNORMAL HIGH (ref 0.1–0.9)
Monocytes: 7 %
Neutrophils Absolute: 9.5 10*3/uL — ABNORMAL HIGH (ref 1.4–7.0)
Neutrophils: 58 %
Platelets: 325 10*3/uL (ref 150–450)
RBC: 4.81 x10E6/uL (ref 3.77–5.28)
RDW: 13 % (ref 11.7–15.4)
WBC: 16.3 10*3/uL — ABNORMAL HIGH (ref 3.4–10.8)

## 2023-11-22 LAB — LIPID PANEL
Chol/HDL Ratio: 8.6 ratio — ABNORMAL HIGH (ref 0.0–4.4)
Cholesterol, Total: 223 mg/dL — ABNORMAL HIGH (ref 100–199)
HDL: 26 mg/dL — ABNORMAL LOW (ref >39)
LDL Chol Calc (NIH): 92 mg/dL (ref 0–99)
Triglycerides: 634 mg/dL (ref 0–149)
VLDL Cholesterol Cal: 105 mg/dL — ABNORMAL HIGH (ref 5–40)

## 2023-11-23 NOTE — Telephone Encounter (Signed)
 Duplicate message for same question. See other encounter

## 2023-11-24 ENCOUNTER — Other Ambulatory Visit: Payer: Self-pay | Admitting: Nurse Practitioner

## 2023-11-24 ENCOUNTER — Telehealth: Payer: Self-pay

## 2023-11-24 DIAGNOSIS — J4520 Mild intermittent asthma, uncomplicated: Secondary | ICD-10-CM

## 2023-11-24 LAB — TOXASSURE SELECT 13 (MW), URINE

## 2023-11-24 NOTE — Telephone Encounter (Signed)
Patient needs PA for [facility]

## 2023-11-27 ENCOUNTER — Other Ambulatory Visit (HOSPITAL_COMMUNITY): Payer: Self-pay

## 2023-11-27 ENCOUNTER — Telehealth: Payer: Self-pay

## 2023-11-27 NOTE — Telephone Encounter (Signed)
 Pharmacy Patient Advocate Encounter   Received notification from Pt Calls Messages that prior authorization for Wegovy 0.25 is required/requested.   Insurance verification completed.   The patient is insured through [facility] .   Per test claim: PA required; PA submitted to above mentioned insurance via CoverMyMeds Key/confirmation #/EOC [id number] Status is pending

## 2023-12-04 ENCOUNTER — Telehealth: Admitting: Physician Assistant

## 2023-12-04 DIAGNOSIS — J4521 Mild intermittent asthma with (acute) exacerbation: Secondary | ICD-10-CM | POA: Diagnosis not present

## 2023-12-04 DIAGNOSIS — J45909 Unspecified asthma, uncomplicated: Secondary | ICD-10-CM | POA: Insufficient documentation

## 2023-12-04 MED ORDER — PREDNISONE 20 MG PO TABS: 40.0000 mg | ORAL_TABLET | Freq: Every day | ORAL | 0 refills | Status: DC

## 2023-12-04 NOTE — Progress Notes (Signed)
 E-Visit for Asthma  Based on what you have shared with me, it looks like you may have a flare up of your asthma.  Asthma is a chronic (ongoing) lung disease which results in airway obstruction, inflammation and hyper-responsiveness.   Asthma symptoms vary from person to person, with common symptoms including nighttime awakening and decreased ability to participate in normal activities as a result of shortness of breath. It is often triggered by changes in weather, changes in the season, changes in air temperature, or inside (home, school, daycare or work) allergens such as animal dander, mold, mildew, woodstoves or cockroaches.   It can also be triggered by hormonal changes, extreme emotion, physical exertion or an upper respiratory tract illness.     It is important to identify the trigger, and then eliminate or avoid the trigger if possible.   If you have been prescribed medications to be taken on a regular basis, it is important to follow the asthma action plan and to follow guidelines to adjust medication in response to increasing symptoms of decreased peak expiratory flow rate  Treatment: I have prescribed: Prednisone 40mg  by mouth per day for 5 - 7 days; Continue Albuterol prescribed on 11/24/23.  Continue allergy medications (Claritin, Zyrtec, Xyzal, or Allegra) AND Flonase or Nasacort.  HOME CARE Only take medications as instructed by your medical team. Consider wearing a mask or scarf to improve breathing air temperature have been shown to decrease irritation and decrease exacerbations Get rest. Taking a steamy shower or using a humidifier may help nasal congestion sand ease sore throat pain. You can place a towel over your head and breathe in the steam from hot water coming from a faucet. Using a saline nasal spray works much the same way.  Cough drops, hare candies and sore  throat lozenges may ease your cough.  Avoid close contacts especially the very you and the elderly Cover your mouth if you cough or sneeze Always remember to wash your hands.    GET HELP RIGHT AWAY IF: You develop worsening symptoms; breathlessness at rest, drowsy, confused or agitated, unable to speak in full sentences You have coughing fits You develop a severe headache or visual changes You develop shortness of breath, difficulty breathing or start having chest pain Your symptoms persist after you have completed your treatment plan If your symptoms do not improve within 10 days  MAKE SURE YOU Understand these instructions. Will watch your condition. Will get help right away if you are not doing well or get worse.   Your e-visit answers were reviewed by a board certified advanced clinical practitioner to complete your personal care plan, Depending upon the condition, your plan could have included both over the counter or prescription medications.   Please review your pharmacy choice. Your safety is important to [place]. If you have drug allergies check your prescription carefully.  You can use MyChart to ask questions about today's visit, request a non-urgent  call back, or ask for a work or school excuse for 24 hours related to this e-Visit. If it has been greater than 24 hours you will need to follow up with your provider, or enter a new e-Visit to address those concerns.   You will get an e-mail in the next two days asking about your experience. I hope that your e-visit has been valuable and will speed your recovery. Thank you for using e-visits.   I have spent 5 minutes in review of e-visit questionnaire, review and updating patient chart, medical  decision making and response to patient.   Margaretann Loveless, PA-C

## 2023-12-06 ENCOUNTER — Ambulatory Visit: Admitting: Nurse Practitioner

## 2023-12-06 ENCOUNTER — Encounter: Payer: Self-pay | Admitting: Nurse Practitioner

## 2023-12-06 VITALS — BP 115/76 | HR 84 | Temp 97.3°F | Ht 65.0 in | Wt 211.2 lb

## 2023-12-06 DIAGNOSIS — R0981 Nasal congestion: Secondary | ICD-10-CM | POA: Diagnosis not present

## 2023-12-06 MED ORDER — AMOXICILLIN-POT CLAVULANATE 875-125 MG PO TABS: 1.0000 | ORAL_TABLET | Freq: Two times a day (BID) | ORAL | 0 refills | Status: DC

## 2023-12-06 NOTE — Progress Notes (Signed)
 Subjective:    Patient ID: Mia Rodgers, female    DOB: 28-Mar-1986, 38 y.o.   MRN: [id number]   Chief Complaint: Nasal Congestion (Started late Saturday night nasal congestion, chest congestion, sinus are swollen.)   Had video visit last week ad was given prednisone. Has been doing flonase daily as well as mucinex DM. No better at all.  URI  This is a new problem. The current episode started in the past 7 days. The problem has been waxing and waning. There has been no fever. Associated symptoms include congestion, coughing, rhinorrhea and sneezing. Treatments tried: prednisone and mucinex DM. The treatment provided mild relief.    Patient Active Problem List   Diagnosis Date Noted   Asthma    GAD (generalized anxiety disorder) 05/02/2023       Review of Systems  HENT:  Positive for congestion, rhinorrhea and sneezing.   Respiratory:  Positive for cough.        Objective:   Physical Exam Constitutional:      Appearance: Normal appearance. She is obese.  HENT:     Right Ear: Tympanic membrane normal.     Left Ear: Tympanic membrane normal.     Nose: Congestion and rhinorrhea present.     Right Sinus: Maxillary sinus tenderness present.     Left Sinus: Maxillary sinus tenderness present.     Mouth/Throat:     Pharynx: No oropharyngeal exudate or posterior oropharyngeal erythema.  Cardiovascular:     Rate and Rhythm: Normal rate and regular rhythm.     Heart sounds: Normal heart sounds.  Pulmonary:     Effort: Pulmonary effort is normal.     Breath sounds: Wheezing (faint) present.     Comments: Deep wet cough Musculoskeletal:     Cervical back: Normal range of motion and neck supple.  Skin:    General: Skin is warm.  Neurological:     General: No focal deficit present.     Mental Status: She is alert and oriented to person, place, and time.  Psychiatric:        Mood and Affect: Mood normal.        Behavior: Behavior normal.     BP 115/76   Pulse 84    Temp (!) 97.3 F (36.3 C)   Ht 5\' 5"  (1.651 m)   Wt 211 lb 3.2 oz (95.8 kg)   LMP 11/07/2023   SpO2 93%   BMI 35.15 kg/m        Assessment & Plan:   [name] in today with chief complaint of Nasal Congestion (Started late Saturday night nasal congestion, chest congestion, sinus are swollen.)   1. Nasal congestion (Primary) 1. Take meds as prescribed 2. Use a cool mist humidifier especially during the winter months and when heat has been humid. 3. Use saline nose sprays frequently 4. Saline irrigations of the nose can be very helpful if done frequently.  * 4X daily for 1 week*  * Use of a nettie pot can be helpful with this. Follow directions with this* 5. Drink plenty of fluids 6. Keep thermostat turn down low 7.For any cough or congestion- mucinex DM 8. For fever or aces or pains- take tylenol or ibuprofen appropriate for age and weight.  * for fevers greater than 101 orally you may alternate ibuprofen and tylenol every  3 hours.    - Novel Coronavirus, NAA ([facility]) - amoxicillin-clavulanate (AUGMENTIN) 875-125 MG tablet; Take 1 tablet by mouth 2 (two)  times daily.  Dispense: 14 tablet; Refill: 0    The above assessment and management plan was discussed with the patient. The patient verbalized understanding of and has agreed to the management plan. Patient is aware to call the clinic if symptoms persist or worsen. Patient is aware when to return to the clinic for a follow-up visit. Patient educated on when it is appropriate to go to the emergency department.   Mia Daphine Deutscher, FNP

## 2023-12-06 NOTE — Patient Instructions (Signed)
 1. Take meds as prescribed 2. Use a cool mist humidifier especially during the winter months and when heat has been humid. 3. Use saline nose sprays frequently 4. Saline irrigations of the nose can be very helpful if done frequently.  * 4X daily for 1 week*  * Use of a nettie pot can be helpful with this. Follow directions with this* 5. Drink plenty of fluids 6. Keep thermostat turn down low 7.For any cough or congestion- mucinex DM 8. For fever or aces or pains- take tylenol or ibuprofen appropriate for age and weight.  * for fevers greater than 101 orally you may alternate ibuprofen and tylenol every  3 hours.

## 2023-12-08 LAB — NOVEL CORONAVIRUS, NAA: SARS-CoV-2, NAA: NOT DETECTED

## 2023-12-12 ENCOUNTER — Other Ambulatory Visit (HOSPITAL_COMMUNITY): Payer: Self-pay

## 2023-12-12 NOTE — Telephone Encounter (Signed)
error 

## 2023-12-13 ENCOUNTER — Other Ambulatory Visit (HOSPITAL_COMMUNITY): Payer: Self-pay

## 2023-12-13 ENCOUNTER — Telehealth: Payer: Self-pay | Admitting: Pharmacy Technician

## 2023-12-13 NOTE — Telephone Encounter (Signed)
 PA request has been Submitted. New Encounter has been or will be created for follow up. For additional info see Pharmacy Prior Auth telephone encounter from 12/13/23.

## 2023-12-13 NOTE — Telephone Encounter (Signed)
 Pharmacy Patient Advocate Encounter   Received notification from Pt Calls Messages that prior authorization for [facility] 0.25MG  is required/requested.   Insurance verification completed.   The patient is insured through [facility] .   Per test claim: PA required; PA submitted to above mentioned insurance via Prompt PA Key/confirmation #/EOC [id number]  Status is pending

## 2023-12-14 ENCOUNTER — Other Ambulatory Visit (HOSPITAL_COMMUNITY): Payer: Self-pay

## 2023-12-14 NOTE — Telephone Encounter (Signed)
 Additional information has been requested from the patient's insurance in order to proceed with the prior authorization request. Requested information has been sent, or form has been filled out and faxed back to [phone]

## 2023-12-15 ENCOUNTER — Other Ambulatory Visit (HOSPITAL_COMMUNITY): Payer: Self-pay

## 2023-12-15 NOTE — Telephone Encounter (Signed)
 Pharmacy Patient Advocate Encounter  Received notification from [facility] that Prior Authorization for [facility] 0.25MG  has been APPROVED and the approval letter will be uploaded to media tab Ran test claim and pt's copay is $25.00  PA #/Case ID/Reference #: [id number]

## 2023-12-26 ENCOUNTER — Other Ambulatory Visit: Payer: Self-pay | Admitting: *Deleted

## 2023-12-26 MED ORDER — IBUPROFEN 800 MG PO TABS: 800.0000 mg | ORAL_TABLET | Freq: Three times a day (TID) | ORAL | 0 refills | Status: DC | PRN

## 2024-02-23 ENCOUNTER — Ambulatory Visit: Admitting: Nurse Practitioner

## 2024-02-23 ENCOUNTER — Encounter: Payer: Self-pay | Admitting: Nurse Practitioner

## 2024-02-23 VITALS — BP 118/78 | HR 96 | Temp 98.2°F | Ht 65.0 in | Wt 207.0 lb

## 2024-02-23 DIAGNOSIS — Z6835 Body mass index (BMI) 35.0-35.9, adult: Secondary | ICD-10-CM | POA: Diagnosis not present

## 2024-02-23 DIAGNOSIS — F411 Generalized anxiety disorder: Secondary | ICD-10-CM | POA: Diagnosis not present

## 2024-02-23 MED ORDER — ALPRAZOLAM 1 MG PO TABS: 1.0000 mg | ORAL_TABLET | Freq: Two times a day (BID) | ORAL | 5 refills | Status: DC

## 2024-02-23 MED ORDER — WEGOVY 0.5 MG/0.5ML ~~LOC~~ SOAJ: 0.5000 mg | SUBCUTANEOUS | 2 refills | Status: DC

## 2024-02-23 NOTE — Patient Instructions (Signed)

## 2024-02-23 NOTE — Progress Notes (Signed)
 Subjective:    Patient ID: Mia Rodgers, female    DOB: [birth year], 38 y.o.   MRN: [id number]   Chief Complaint: medical management of chronic issues     HPI:  Mia Rodgers is a 38 y.o. who identifies as a female who was assigned female at birth.   Social history: Lives with: husband and 2 sons Work history: [employer]   Comes in today for follow up of the following chronic medical issues:  1. GAD (generalized anxiety disorder) Has xanax  to take as needed. She does not take everyday    11/21/2023    3:03 PM 08/24/2023   12:24 PM 05/02/2023   12:02 PM 10/25/2022    8:13 AM  GAD 7 : Generalized Anxiety Score  Nervous, Anxious, on Edge 1 2 1 1   Control/stop worrying 1 2 1 1   Worry too much - different things 1 2 1 1   Trouble relaxing 1 2 1 1   Restless 1 1 1  0  Easily annoyed or irritable 1 2 1 1   Afraid - awful might happen 0 0 1 0  Total GAD 7 Score 6 11 7 5   Anxiety Difficulty Not difficult at all Somewhat difficult Somewhat difficult Somewhat difficult        2. BMI 35.0-35.9,adult Weight is down 5  lbs Wt Readings from Last 3 Encounters:  12/06/23 211 lb 3.2 oz (95.8 kg)  11/21/23 216 lb (98 kg)  11/07/23 209 lb 3.2 oz (94.9 kg)   BMI Readings from Last 3 Encounters:  12/06/23 35.15 kg/m  11/21/23 35.94 kg/m  11/07/23 34.81 kg/m    New complaints: None today  Allergies  Allergen Reactions   Hydrocodone    Hydrocodone-Acetaminophen Nausea And Vomiting   Outpatient Encounter Medications as of 02/23/2024  Medication Sig   albuterol  (VENTOLIN  HFA) 108 (90 Base) MCG/ACT inhaler USE 2 PUFFS EVERY 6 HOURS AS NEEDED   ALPRAZolam  (XANAX ) 1 MG tablet Take 1 tablet (1 mg total) by mouth 2 (two) times daily.   amoxicillin -clavulanate (AUGMENTIN ) 875-125 MG tablet Take 1 tablet by mouth 2 (two) times daily.   cetirizine (ZYRTEC) 10 MG tablet Take 10 mg by mouth daily.   ibuprofen  (ADVIL ) 800 MG tablet Take 1 tablet (800 mg total) by mouth every 8  (eight) hours as needed.   ondansetron  (ZOFRAN ) 4 MG tablet Take 1 tablet (4 mg total) by mouth every 8 (eight) hours as needed for nausea or vomiting.   predniSONE  (DELTASONE ) 20 MG tablet Take 2 tablets (40 mg total) by mouth daily with breakfast.   Semaglutide -Weight Management (WEGOVY ) 0.25 MG/0.5ML SOAJ Inject 0.25 mg into the skin once a week.   No facility-administered encounter medications on file as of 02/23/2024.    Past Surgical History:  Procedure Laterality Date   CESAREAN SECTION     TONSILLECTOMY      No family history on file.    Controlled substance contract: n/a      Review of Systems  Constitutional:  Negative for diaphoresis.  Eyes:  Negative for pain.  Respiratory:  Negative for shortness of breath.   Cardiovascular:  Negative for chest pain, palpitations and leg swelling.  Gastrointestinal:  Negative for abdominal pain.  Endocrine: Negative for polydipsia.  Skin:  Negative for rash.  Neurological:  Negative for dizziness, weakness and headaches.  Hematological:  Does not bruise/bleed easily.  All other systems reviewed and are negative.      Objective:   Physical Exam Vitals and nursing  note reviewed.  Constitutional:      General: She is not in acute distress.    Appearance: Normal appearance. She is well-developed.  HENT:     Head: Normocephalic.     Right Ear: Tympanic membrane normal.     Left Ear: Tympanic membrane normal.     Nose: Nose normal.     Mouth/Throat:     Mouth: Mucous membranes are moist.   Eyes:     Pupils: Pupils are equal, round, and reactive to light.   Neck:     Vascular: No carotid bruit or JVD.   Cardiovascular:     Rate and Rhythm: Normal rate and regular rhythm.     Heart sounds: Normal heart sounds.  Pulmonary:     Effort: Pulmonary effort is normal. No respiratory distress.     Breath sounds: Normal breath sounds. No wheezing or rales.  Chest:     Chest wall: No tenderness.  Abdominal:     General:  Bowel sounds are normal. There is no distension or abdominal bruit.     Palpations: Abdomen is soft. There is no hepatomegaly, splenomegaly, mass or pulsatile mass.     Tenderness: There is no abdominal tenderness.   Musculoskeletal:        General: Normal range of motion.     Cervical back: Normal range of motion and neck supple.  Lymphadenopathy:     Cervical: No cervical adenopathy.   Skin:    General: Skin is warm and dry.   Neurological:     Mental Status: She is alert and oriented to person, place, and time.     Deep Tendon Reflexes: Reflexes are normal and symmetric.   Psychiatric:        Behavior: Behavior normal.        Thought Content: Thought content normal.        Judgment: Judgment normal.    BP 118/78   Pulse 96   Temp 98.2 F (36.8 C) (Temporal)   Ht 5' 5 (1.651 m)   Wt 207 lb (93.9 kg)   SpO2 95%   BMI 34.45 kg/m        Assessment & Plan:   Mia Rodgers in today with chief complaint of medical management of chronic issues    1. GAD (generalized anxiety disorder) (Primary) Stress management - ALPRAZolam  (XANAX ) 1 MG tablet; Take 1 tablet (1 mg total) by mouth 2 (two) times daily.  Dispense: 60 tablet; Refill: 5  2. BMI 35.0-35.9,adult Discussed diet and exercise for person with BMI >25 Will recheck weight in 3-6 months Will try  to get wegovy  filled again - Semaglutide -Weight Management (WEGOVY ) 0.5MG /0.5ML SOAJ; Inject 0.25 mg into the skin once a week.  Dispense: 2 mL; Refill: 3    The above assessment and management plan was discussed with the patient. The patient verbalized understanding of and has agreed to the management plan. Patient is aware to call the clinic if symptoms persist or worsen. Patient is aware when to return to the clinic for a follow-up visit. Patient educated on when it is appropriate to go to the emergency department.   Mary-Margaret Gaylyn Keas, FNP

## 2024-04-02 ENCOUNTER — Other Ambulatory Visit: Payer: Self-pay | Admitting: Family

## 2024-04-22 ENCOUNTER — Other Ambulatory Visit: Payer: Self-pay | Admitting: Nurse Practitioner

## 2024-04-22 DIAGNOSIS — J4520 Mild intermittent asthma, uncomplicated: Secondary | ICD-10-CM

## 2024-05-23 ENCOUNTER — Other Ambulatory Visit: Payer: Self-pay | Admitting: Nurse Practitioner

## 2024-05-23 DIAGNOSIS — J4520 Mild intermittent asthma, uncomplicated: Secondary | ICD-10-CM

## 2024-05-28 ENCOUNTER — Encounter: Payer: Self-pay | Admitting: Nurse Practitioner

## 2024-05-28 ENCOUNTER — Telehealth: Payer: Self-pay | Admitting: Family Medicine

## 2024-05-28 ENCOUNTER — Ambulatory Visit: Admitting: Nurse Practitioner

## 2024-05-28 VITALS — BP 110/69 | HR 91 | Temp 98.1°F | Ht 65.0 in | Wt 216.0 lb

## 2024-05-28 DIAGNOSIS — Z6835 Body mass index (BMI) 35.0-35.9, adult: Secondary | ICD-10-CM | POA: Diagnosis not present

## 2024-05-28 MED ORDER — TIRZEPATIDE-WEIGHT MANAGEMENT 2.5 MG/0.5ML ~~LOC~~ SOAJ: 2.5000 mg | SUBCUTANEOUS | 2 refills | Status: DC

## 2024-05-28 MED ORDER — TIRZEPATIDE-WEIGHT MANAGEMENT 2.5 MG/0.5ML ~~LOC~~ SOLN: 2.5000 mg | SUBCUTANEOUS | 2 refills | Status: DC

## 2024-05-28 NOTE — Telephone Encounter (Signed)
 Please review

## 2024-05-28 NOTE — Progress Notes (Signed)
 Subjective:    Patient ID: Mia Rodgers, female    DOB: [birth year], 38 y.o.   MRN: [id number]   Chief Complaint: medical management of chronic issues     HPI:  Mia Rodgers is a 38 y.o. who identifies as a female who was assigned female at birth.   Social history: Lives with: husband and 2 sons Work history: [employer]   Comes in today for follow up of the following chronic medical issues:  1. GAD (generalized anxiety disorder) Has xanax  to take as needed. She does not take everyday    05/28/2024    8:37 AM 02/23/2024   11:07 AM 11/21/2023    3:03 PM 08/24/2023   12:24 PM  GAD 7 : Generalized Anxiety Score  Nervous, Anxious, on Edge 1 1 1 2   Control/stop worrying 1 1 1 2   Worry too much - different things 1 1 1 2   Trouble relaxing 1 1 1 2   Restless 1 1 1 1   Easily annoyed or irritable 1 1 1 2   Afraid - awful might happen 1 1 0 0  Total GAD 7 Score 7 7 6 11   Anxiety Difficulty Somewhat difficult Somewhat difficult Not difficult at all Somewhat difficult        2. BMI 35.0-35.9,adult Weight is up 9  lbs. Patient was on wegovy  and had to stop taking it because of migraines. Would like to try zepbound  Wt Readings from Last 3 Encounters:  05/28/24 216 lb (98 kg)  02/23/24 207 lb (93.9 kg)  12/06/23 211 lb 3.2 oz (95.8 kg)   BMI Readings from Last 3 Encounters:  05/28/24 35.94 kg/m  02/23/24 34.45 kg/m  12/06/23 35.15 kg/m    New complaints: None today  Allergies  Allergen Reactions   Hydrocodone    Hydrocodone-Acetaminophen Nausea And Vomiting   Outpatient Encounter Medications as of 05/28/2024  Medication Sig   albuterol  (VENTOLIN  HFA) 108 (90 Base) MCG/ACT inhaler USE 2 PUFFS EVERY 6 HOURS AS NEEDED   ALPRAZolam  (XANAX ) 1 MG tablet Take 1 tablet (1 mg total) by mouth 2 (two) times daily.   cetirizine (ZYRTEC) 10 MG tablet Take 10 mg by mouth daily.   ibuprofen  (ADVIL ) 800 MG tablet TAKE ONE TABLET BY MOUTH EVERY EIGHT HOURS AS NEEDED    ondansetron  (ZOFRAN ) 4 MG tablet Take 1 tablet (4 mg total) by mouth every 8 (eight) hours as needed for nausea or vomiting.   [DISCONTINUED] amoxicillin -clavulanate (AUGMENTIN ) 875-125 MG tablet Take 1 tablet by mouth 2 (two) times daily.   [DISCONTINUED] predniSONE  (DELTASONE ) 20 MG tablet Take 2 tablets (40 mg total) by mouth daily with breakfast.   [DISCONTINUED] Semaglutide -Weight Management (WEGOVY ) 0.5 MG/0.5ML SOAJ Inject 0.5 mg into the skin once a week.   No facility-administered encounter medications on file as of 05/28/2024.    Past Surgical History:  Procedure Laterality Date   CESAREAN SECTION     TONSILLECTOMY      History reviewed. No pertinent family history.    Controlled substance contract: n/a      Review of Systems  Constitutional:  Negative for diaphoresis.  Eyes:  Negative for pain.  Respiratory:  Negative for shortness of breath.   Cardiovascular:  Negative for chest pain, palpitations and leg swelling.  Gastrointestinal:  Negative for abdominal pain.  Endocrine: Negative for polydipsia.  Skin:  Negative for rash.  Neurological:  Negative for dizziness, weakness and headaches.  Hematological:  Does not bruise/bleed easily.  All other systems reviewed  and are negative.      Objective:   Physical Exam Vitals and nursing note reviewed.  Constitutional:      General: She is not in acute distress.    Appearance: Normal appearance. She is well-developed.  HENT:     Head: Normocephalic.     Right Ear: Tympanic membrane normal.     Left Ear: Tympanic membrane normal.     Nose: Nose normal.     Mouth/Throat:     Mouth: Mucous membranes are moist.  Eyes:     Pupils: Pupils are equal, round, and reactive to light.  Neck:     Vascular: No carotid bruit or JVD.  Cardiovascular:     Rate and Rhythm: Normal rate and regular rhythm.     Heart sounds: Normal heart sounds.  Pulmonary:     Effort: Pulmonary effort is normal. No respiratory distress.      Breath sounds: Normal breath sounds. No wheezing or rales.  Chest:     Chest wall: No tenderness.  Abdominal:     General: Bowel sounds are normal. There is no distension or abdominal bruit.     Palpations: Abdomen is soft. There is no hepatomegaly, splenomegaly, mass or pulsatile mass.     Tenderness: There is no abdominal tenderness.  Musculoskeletal:        General: Normal range of motion.     Cervical back: Normal range of motion and neck supple.  Lymphadenopathy:     Cervical: No cervical adenopathy.  Skin:    General: Skin is warm and dry.  Neurological:     Mental Status: She is alert and oriented to person, place, and time.     Deep Tendon Reflexes: Reflexes are normal and symmetric.  Psychiatric:        Behavior: Behavior normal.        Thought Content: Thought content normal.        Judgment: Judgment normal.    BP 110/69   Pulse 91   Temp 98.1 F (36.7 C) (Temporal)   Ht 5' 5 (1.651 m)   Wt 216 lb (98 kg)   SpO2 97%   BMI 35.94 kg/m        Assessment & Plan:   [name] in today with chief complaint of Discuss cholesterol   1. BMI 35.0-35.9,adult (Primary) Low fat diet exercise - tirzepatide  (ZEPBOUND ) 2.5 MG/0.5ML injection vial; Inject 2.5 mg into the skin once a week.  Dispense: 2 mL; Refill: 2    The above assessment and management plan was discussed with the patient. The patient verbalized understanding of and has agreed to the management plan. Patient is aware to call the clinic if symptoms persist or worsen. Patient is aware when to return to the clinic for a follow-up visit. Patient educated on when it is appropriate to go to the emergency department.   Mary-Margaret Gladis, FNP

## 2024-05-28 NOTE — Telephone Encounter (Signed)
 Copied from CRM [id number]. Topic: Clinical - Prescription Issue >> May 28, 2024  8:52 AM Mia Rodgers wrote: Reason for CRM: [facility] let [facility]  know that they cannot fulfill the rx for Zepbound . They said it needs to go through [facility] directly for the name brand. [facility] however is able to fulfill a request for prefilled syringes but they will need a new rx request sent to them for that. Best call back number is: [phone].

## 2024-06-18 ENCOUNTER — Other Ambulatory Visit: Payer: Self-pay | Admitting: Nurse Practitioner

## 2024-06-18 DIAGNOSIS — J4520 Mild intermittent asthma, uncomplicated: Secondary | ICD-10-CM

## 2024-06-19 ENCOUNTER — Ambulatory Visit: Payer: Self-pay | Admitting: Nurse Practitioner

## 2024-06-19 ENCOUNTER — Telehealth (INDEPENDENT_AMBULATORY_CARE_PROVIDER_SITE_OTHER): Admitting: Nurse Practitioner

## 2024-06-19 ENCOUNTER — Encounter: Payer: Self-pay | Admitting: Nurse Practitioner

## 2024-06-19 DIAGNOSIS — N200 Calculus of kidney: Secondary | ICD-10-CM

## 2024-06-19 DIAGNOSIS — R300 Dysuria: Secondary | ICD-10-CM | POA: Diagnosis not present

## 2024-06-19 DIAGNOSIS — R10A3 Flank pain, bilateral: Secondary | ICD-10-CM

## 2024-06-19 NOTE — Telephone Encounter (Signed)
 FYI Only or Action Required?: Action required by provider: urine sample order request.  Patient was last seen in primary care on 05/28/2024 by Mia Mustard, FNP.  Called Nurse Triage reporting Urinary Tract Infection.  Symptoms began yesterday.  Triage Disposition: See HCP Within 4 Hours (Or PCP Triage)  Patient/caregiver understands and will follow disposition?: Yes      Reason for Triage: possible UTI, blood in urine, frequency, abdominal cramping- [phone]  Reason for Disposition  Side (flank) or lower back pain present  Answer Assessment - Initial Assessment Questions This RN scheduled pt for a virtual appointment tonight at 6:15 PM so that pt has time to stop by office on way home from work for a urine sample. This RN spoke with CAL who said this was fine to do as long as there is an order in place for a urine sample. This RN will send a high priority message to clinic so an order can be placed for patient.  Pt unable to come into office for an appointment time due to conflicts with work schedule.  Pt states last night she noticed light pink when wiping; pt states her menstrual cycle ended at the end of September Mild stomach discomfort intermittent, described as a pressure instead of pain Clear, jelly like liquid when wiping; denies odor A little lower back pain Denies pain with urination, fever  CAUSE: What do you think is causing the symptoms?     UTI  Protocols used: Urinary Symptoms-A-AH

## 2024-06-19 NOTE — Telephone Encounter (Signed)
 Patient scheduled to be seen for this today.

## 2024-06-19 NOTE — Progress Notes (Unsigned)
 Virtual Visit via video Note Due to COVID-19 pandemic this visit was conducted virtually. This visit type was conducted due to national recommendations for restrictions regarding the COVID-19 Pandemic (e.g. social distancing, sheltering in place) in an effort to limit this patient's exposure and mitigate transmission in our community. All issues noted in this document were discussed and addressed.  A physical exam was not performed with this format.   I connected with Mia Rodgers on 06/19/2024 at 4:00 by name and DOB and verified that I am speaking with the correct person using two identifiers. Mia Rodgers is currently located at in her car and  during visit. The provider, Nena Deitra Morton Sebastian, NP is located in their office at time of visit.  I discussed the limitations, risks, security and privacy concerns of performing an evaluation and management service by virtual visit and the availability of in person appointments. I also discussed with the patient that there may be a patient responsible charge related to this service. The patient expressed understanding and agreed to proceed.  Subjective:  Patient ID: Mia Rodgers, female    DOB: [birth year], 38 y.o.   MRN: [id number]  Chief Complaint:  Urinary Tract Infection (Dysuria symptoms started last night)   HPI: Mia Rodgers is a 38 y.o. female presenting on 06/19/2024 for Urinary Tract Infection (Dysuria symptoms started last night)  I am and reports that her symptoms started last night she went to the bathroom and wiped and saw pink on the tissue and experiencing abdominal discomfort dysuria lower back pain started this morning while at work.  Denies any nausea vomiting fever or chills.  She will be coming to the ED clinic to give a urine sample be was explained to her that a lab tech leave at 430 and she will I am not high and to run the urine if she comes after [birth year]19[birth year] urine will be put in the fridge and will be processed in the  morning    Relevant past medical, surgical, family, and social history reviewed and updated as indicated.  Allergies and medications reviewed and updated.   Past Medical History:  Diagnosis Date   Allergies    Asthma    Hypercholesterolemia     Past Surgical History:  Procedure Laterality Date   CESAREAN SECTION     TONSILLECTOMY      Social History   Socioeconomic History   Marital status: Married    Spouse name: Not on file   Number of children: Not on file   Years of education: Not on file   Highest education level: GED or equivalent  Occupational History   Not on file  Tobacco Use   Smoking status: Every Day    Current packs/day: 0.50    Types: Cigarettes   Smokeless tobacco: Never  Substance and Sexual Activity   Alcohol use: No   Drug use: No   Sexual activity: Yes    Birth control/protection: Surgical  Other Topics Concern   Not on file  Social History Narrative   Not on file   Social Drivers of Health   Financial Resource Strain: Low Risk  (05/27/2024)   Overall Financial Resource Strain (CARDIA)    Difficulty of Paying Living Expenses: Not very hard  Food Insecurity: Food Insecurity Present (05/27/2024)   Hunger Vital Sign    Worried About Running Out of Food in the Last Year: Sometimes true    Ran Out of Food in the Last Year: Sometimes  true  Transportation Needs: No Transportation Needs (05/27/2024)   PRAPARE - Administrator, Civil Service (Medical): No    Lack of Transportation (Non-Medical): No  Physical Activity: Insufficiently Active (05/27/2024)   Exercise Vital Sign    Days of Exercise per Week: 3 days    Minutes of Exercise per Session: 40 min  Stress: Stress Concern Present (05/27/2024)   [employer] of Occupational Health - Occupational Stress Questionnaire    Feeling of Stress: Rather much  Social Connections: Moderately Integrated (05/27/2024)   Social Connection and Isolation Panel    Frequency of Communication  with Friends and Family: More than three times a week    Frequency of Social Gatherings with Friends and Family: More than three times a week    Attends Religious Services: 1 to 4 times per year    Active Member of [employer] or Organizations: No    Attends Banker Meetings: Not on file    Marital Status: Married  Intimate Partner Violence: Not on file    Outpatient Encounter Medications as of 06/19/2024  Medication Sig   ketorolac (TORADOL) 10 MG tablet Take 1 tablet (10 mg total) by mouth every 6 (six) hours as needed.   tamsulosin (FLOMAX) 0.4 MG CAPS capsule Take 1 capsule (0.4 mg total) by mouth Rodgers.   albuterol  (VENTOLIN  HFA) 108 (90 Base) MCG/ACT inhaler USE 2 PUFFS EVERY 6 HOURS AS NEEDED   ALPRAZolam  (XANAX ) 1 MG tablet Take 1 tablet (1 mg total) by mouth 2 (two) times Rodgers.   cetirizine (ZYRTEC) 10 MG tablet Take 10 mg by mouth Rodgers.   ondansetron  (ZOFRAN ) 4 MG tablet Take 1 tablet (4 mg total) by mouth every 8 (eight) hours as needed for nausea or vomiting.   tirzepatide  (ZEPBOUND ) 2.5 MG/0.5ML Pen Inject 2.5 mg into the skin once a week.   [DISCONTINUED] ibuprofen  (ADVIL ) 800 MG tablet TAKE ONE TABLET BY MOUTH EVERY EIGHT HOURS AS NEEDED   No facility-administered encounter medications on file as of 06/19/2024.    Allergies  Allergen Reactions   Hydrocodone    Hydrocodone-Acetaminophen Nausea And Vomiting    Review of Systems  Constitutional:  Negative for chills and fatigue.  Respiratory:  Negative for chest tightness and shortness of breath.   Cardiovascular:  Negative for chest pain and leg swelling.  Gastrointestinal:  Negative for abdominal pain.  Genitourinary:  Positive for dysuria, flank pain and urgency.  Skin:  Negative for rash.  Neurological:  Negative for dizziness and facial asymmetry.     Observations/Objective: No vital signs or physical exam, this was a virtual health encounter.  Pt alert and oriented, answers all questions  appropriately, and able to speak in full sentences.   Urine dipstick shows positive for WBC's trace is.   Micro exam: 0-2 RBC's per HPF, present crystals seen, calcium oxalate.   Assessment and Plan: Mia Rodgers was seen today for urinary tract infection.  Diagnoses and all orders for this visit:  Dysuria -     Urinalysis, Routine w reflex microscopic  Renal calculi -     tamsulosin (FLOMAX) 0.4 MG CAPS capsule; Take 1 capsule (0.4 mg total) by mouth Rodgers. -     ketorolac (TORADOL) 10 MG tablet; Take 1 tablet (10 mg total) by mouth every 6 (six) hours as needed.  Bilateral flank pain -     tamsulosin (FLOMAX) 0.4 MG CAPS capsule; Take 1 capsule (0.4 mg total) by mouth Rodgers. -     ketorolac (  TORADOL) 10 MG tablet; Take 1 tablet (10 mg total) by mouth every 6 (six) hours as needed.   Mia Rodgers and is a 38 year old Caucasian female seen today via telehealth for dysuria She will come in the clinic for urine sample, if it is after father the urine will be placed in the fridge and reprocess in the morning and she will be called with the results - Urine positive this morning; client will contacted with results, Flomax and Toradol sent to her pharmacy for renal calculi Increase hydration   Clinical References  Dietary Guidelines to Help Prevent Kidney Stones Kidney stones are deposits of minerals and salts that form inside your kidneys. Your risk of developing kidney stones may be greater depending on your diet, your lifestyle, the medicines you take, and whether you have certain medical conditions. Most people can lower their risks of developing kidney stones by following these dietary guidelines. Your dietitian may give you more specific instructions depending on your overall health and the type of kidney stones you tend to develop. What are tips for following this plan? Reading food labels  Choose foods with no salt added or low-salt labels. Limit your salt (sodium) intake to less than  1,500 mg a day. Choose foods with calcium for each meal and snack. Try to eat about 300 mg of calcium at each meal. Foods that contain 200-500 mg of calcium a serving include: 8 oz (237 mL) of milk, calcium-fortifiednon-dairy milk, and calcium-fortifiedfruit juice. Calcium-fortified means that calcium has been added to these drinks. 8 oz (237 mL) of kefir, yogurt, and soy yogurt. 4 oz (114 g) of tofu. 1 oz (28 g) of cheese. 1 cup (150 g) of dried figs. 1 cup (91 g) of cooked broccoli. One 3 oz (85 g) can of sardines or mackerel. Most people need 1,000-1,500 mg of calcium a day. Talk to your dietitian about how much calcium is recommended for you. Shopping Buy plenty of fresh fruits and vegetables. Most people do not need to avoid fruits and vegetables, even if these foods contain nutrients that may contribute to kidney stones. When shopping for convenience foods, choose: Whole pieces of fruit. Pre-made salads with dressing on the side. Low-fat fruit and yogurt smoothies. Avoid buying frozen meals or prepared deli foods. These can be high in sodium. Look for foods with live cultures, such as yogurt and kefir. Choose high-fiber grains, such as whole-wheat breads, oat bran, and wheat cereals. Cooking Do not add salt to food when cooking. Place a salt shaker on the table and allow each person to add their own salt to taste. Use vegetable protein, such as beans, textured vegetable protein (TVP), or tofu, instead of meat in pasta, casseroles, and soups. Meal planning Eat less salt, if told by your dietitian. To do this: Avoid eating processed or pre-made food. Avoid eating fast food. Eat less animal protein, including cheese, meat, poultry, or fish, if told by your dietitian. To do this: Limit the number of times you have meat, poultry, fish, or cheese each week. Eat a diet free of meat at least 2 days a week. Eat only one serving each day of meat, poultry, fish, or seafood. When you prepare  animal proteins, cut pieces into small portion sizes. For most meat and fish, one serving is about the size of the palm of your hand. Eat at least five servings of fresh fruits and vegetables each day. To do this: Keep fruits and vegetables on hand for snacks. Eat one piece  of fruit or a handful of berries with breakfast. Have a salad and fruit at lunch. Have two kinds of vegetables at dinner. You may be told to limit foods that are high in a substance called oxalate. These include: Spinach (cooked), rhubarb, beets, sweet potatoes, and Swiss chard. Peanuts. Potato chips, french fries, and baked potatoes with skin on. Nuts and nut products. Chocolate. If you regularly take a diuretic medicine, make sure to eat at least 1 or 2 servings of fruits or vegetables that are high in potassium each day. These include: Avocado. Banana. Orange, prune, carrot, or tomato juice. Baked potato. Cabbage. Beans and split peas. Lifestyle  Drink enough fluid to keep your urine pale yellow. This is the most important thing you can do. Spread your fluid intake throughout the day. If you drink alcohol: Limit how much you have to: 0-1 drink a day for women who are not pregnant. 0-2 drinks a day for men. Know how much alcohol is in your drink. In the [place] one drink equals one 12 oz bottle of beer (355 mL), one 5 oz glass of wine (148 mL), or one 1 oz glass of hard liquor (44 mL). Lose weight if told by your health care provider. Work with your dietitian to find an eating plan and weight loss strategies that work best for you. General information Talk to your health care provider and dietitian about taking Rodgers supplements. Depending on your health and the cause of your kidney stones, you may be told: Do not take high-dose supplements of vitamin C (1,000 mg a day or more). To take a calcium supplement. To take a Rodgers probiotic supplement. To take other supplements such as magnesium, fish oil, or vitamin  B6. Take over-the-counter and prescription medicines only as told by your health care provider. These include supplements. What foods should I limit? Limit your intake of the following foods, or eat them as told by your dietitian. Vegetables Spinach. Rhubarb. Beets. Canned vegetables. Dene. Olives. Baked potatoes with skin. Grains Wheat bran. Baked goods. Salted crackers. Cereals high in sugar. Meats and other proteins Nuts. Nut butters. Large portions of meat, poultry, or fish. Salted, precooked, or cured meats, such as sausages, meat loaves, and hot dogs. Dairy Cheeses. Beverages Regular soft drinks. Regular vegetable juice. Seasonings and condiments Seasoning blends with salt. Salad dressings. Soy sauce. Ketchup. Barbecue sauce. Other foods Canned soups. Canned pasta sauce. Casseroles. Pizza. Lasagna. Frozen meals. Potato chips. [place] fries. The items listed above may not be a complete list of foods and beverages you should limit. Contact a dietitian for more information. What foods should I avoid? Talk to your dietitian about specific foods you should avoid based on the type of kidney stones you have and your overall health. Fruits Grapefruit. The item listed above may not be a complete list of foods and beverages you should avoid. Contact a dietitian for more information. Summary Kidney stones are deposits of minerals and salts that form inside your kidneys. You can lower your risk of kidney stones by making changes to your diet. The most important thing you can do is drink enough fluid. Drink enough fluid to keep your urine pale yellow. Talk to your dietitian about how much calcium you should have each day, and eat less salt and animal protein as told by your dietitian. This information is not intended to replace advice given to you by your health care provider. Make sure you discuss any questions you have with your health care provider.  Document Revised: 12/09/2021 Document  Reviewed: 12/09/2021 Elsevier Patient Education  2024 Elsevier Inc. Kidney Stones  Kidney stones are solid, rock-like deposits that form inside of the kidneys. The kidneys are a pair of organs that make urine. A kidney stone may form in a kidney and move into other parts of the urinary tract, including the tubes that connect the kidneys to the bladder (ureters), the bladder, and the tube that carries urine out of the body (urethra). As the stone moves through these areas, it can cause intense pain and block the flow of urine. Kidney stones are created when high levels of certain minerals are found in the urine. The stones are usually passed out of the body through urination, but in some cases, medical treatment may be needed to remove them. What are the causes? Kidney stones may be caused by: A condition in which certain glands produce too much parathyroid hormone (primary hyperparathyroidism), which causes too much calcium buildup in the blood. A buildup of uric acid crystals in the bladder (hyperuricosuria). Uric acid is a chemical that the body produces when you eat certain foods. It usually leaves the body in the urine. Narrowing (stricture) of one or both of the ureters. A kidney blockage that is present at birth (congenital obstruction). Past surgery on the kidney or the ureters. What increases the risk? The following factors may make you more likely to develop this condition: Having had a kidney stone in the past. Having a family history of kidney stones. Not drinking enough water. Eating a diet that is high in protein, salt (sodium), or sugar. Being overweight or obese. What are the signs or symptoms? Symptoms of a kidney stone may include: Pain in the side of the abdomen, right below the ribs (flank pain). Pain usually spreads (radiates) to the groin. Needing to urinate often or urgently. Painful urination. Blood in the urine (hematuria). Nausea. Vomiting. Fever and chills. How  is this diagnosed? This condition may be diagnosed based on: Your symptoms and medical history. A physical exam. Blood tests. Urine tests. These may be done before and after the stone passes out of your body through urination. Imaging tests, such as a CT scan, abdominal X-ray, or ultrasound. A procedure to examine the inside of the bladder (cystoscopy). How is this treated? Treatment for kidney stones depends on the size, location, and makeup of the stones. Kidney stones will often pass out of the body through urination. You may need to: Increase your fluid intake to help pass the stone. In some cases, you may be given fluids through an IV and may need to be monitored in the hospital. Take medicine for pain. Make changes in your diet to help prevent kidney stones from coming back. Sometimes, procedures are needed to remove a kidney stone. This may involve: A procedure to break up kidney stones using: A focused beam of light (laser therapy). Shock waves (extracorporeal shock wave lithotripsy). Surgery to remove kidney stones. This may be needed if you have severe pain or have stones that block your urinary tract. Follow these instructions at home: Medicines Take over-the-counter and prescription medicines only as told by your health care provider. Ask your health care provider if the medicine prescribed to you requires you to avoid driving or using heavy machinery. Eating and drinking Drink enough fluid to keep your urine pale yellow. You may be instructed to drink at least 8-10 glasses of water each day. This will help you pass the kidney stone. If directed, change  your diet. This may include: Limiting how much sodium you eat. Eating more fruits and vegetables. Limiting how much animal protein you eat. Animal proteins include red meat, poultry, fish, and eggs. Eating a normal amount of calcium (1,000-1,300 mg per day). Follow instructions from your health care provider about eating or  drinking restrictions. General instructions Collect urine samples as told by your health care provider. You may need to collect a urine sample: 24 hours after you pass the stone. 8-12 weeks after you pass the kidney stone, and every 6-12 months after that. Strain your urine every time you urinate, for as long as directed. Use the strainer that your health care provider recommends. Do not throw out the kidney stone after passing it. Keep the stone so it can be tested by your health care provider. Testing the makeup of your kidney stone may help prevent you from getting kidney stones in the future. Keep all follow-up visits. You may need follow-up X-rays or ultrasounds to make sure that your stone has passed. How is this prevented? To prevent another kidney stone: Drink enough fluid to keep your urine pale yellow. This is the best way to prevent kidney stones. Eat a healthy diet. Follow recommendations from your health care provider about foods to avoid. Recommendations vary depending on the type of kidney stone that you have. You may be instructed to eat a low-protein diet. Maintain a healthy weight. Where to find more information National Kidney Foundation (NKF): www.kidney.org [facility]): www.urologyhealth.org Contact a health care provider if: You have pain that gets worse or does not get better with medicine. Get help right away if: You have a fever or chills. You develop severe pain. You develop new abdominal pain. You faint. You are unable to urinate. Summary Kidney stones are solid, rock-like deposits that form inside of the kidneys. Kidney stones can cause nausea, vomiting, blood in the urine, abdominal pain, and the urge to urinate often. Treatment for kidney stones depends on the size, location, and makeup of the stones. Kidney stones will often pass out of the body through urination. Kidney stones can be prevented by drinking enough fluids, eating a healthy  diet, and maintaining a healthy weight. This information is not intended to replace advice given to you by your health care provider. Make sure you discuss any questions you have with your health care provider. Document Revised: 12/08/2021 Document Reviewed: 12/08/2021 Elsevier Patient Education  2024 Elsevier Inc. Kidney Stones Kidney stones are rock-like masses that form inside of the kidneys. Kidneys are organs that make pee (urine). A kidney stone may move into other parts of the urinary tract, including: The tubes that connect the kidneys to the bladder (ureters). The bladder. The tube that carries urine out of the body (urethra). Kidney stones can cause very bad pain and can block the flow of pee. The stone usually leaves your body through your pee. A doctor may need to take out the stone. What are the causes? Kidney stones may be caused by: Too much calcium in the body. This may be caused by too much parathyroid hormone in the blood. Uric acid crystals in the bladder. The body makes uric acid when you eat certain foods. Narrowing of one or both of the ureters. A kidney blockage that you were born with. Past surgery on the kidney or the ureters. What increases the risk? You are more likely to develop this condition if: You have had a kidney stone in the past. Other  people in your family have had kidney stones. You do not drink enough water. You eat a diet that is high in protein, salt (sodium), or sugar. You are very overweight (obese). What are the signs or symptoms? Symptoms of a kidney stone may include: Pain in the side of the belly, right below the ribs. Pain usually spreads to the groin. Needing to pee often or right away. Pain when peeing. Blood in your pee. Feeling like you may vomit (nauseous). Vomiting. Fever and chills. How is this treated? Treatment depends on the size, location, and makeup of the kidney stones. The stones will often pass out of the body when you  pee. You may need to: Drink more fluid to help pass the stone. In some cases, you may be given fluids through an IV tube at the hospital. Take medicine for pain. Change your diet to help keep kidney stones from coming back. Sometimes, you may need: A procedure to break up kidney stones using a beam of light (laser) or shock waves. Surgery to remove the kidney stones. Follow these instructions at home: Medicines Take over-the-counter and prescription medicines only as told by your doctor. Ask your doctor if the medicine prescribed to you requires you to avoid driving or using machinery. Eating and drinking Drink enough fluid to keep your pee pale yellow. You may be told to drink at least 8-10 glasses of water each day. This will help you pass the stone. If told by your doctor, change your diet. You may be told to: Limit how much salt you eat. Eat more fruits and vegetables. Limit how much meat, poultry, fish, and eggs you eat. Follow instructions from your doctor about what you may eat and drink. General instructions Collect pee samples as told by your doctor. You may need to collect a pee sample: 24 hours after a stone comes out. 8-12 weeks after a stone comes out, and every 6-12 months after that. Strain your pee every time you pee. Use the strainer that your doctor recommends. Do not throw out the stone. Keep it so that it can be tested by your doctor. Keep all follow-up visits. You may need X-rays and ultrasounds to make sure the stone has come out. How is this prevented? To prevent another kidney stone: Drink enough fluid to keep your pee pale yellow. This is the best way to prevent kidney stones. Eat healthy foods. Avoid certain foods as told by your doctor. You may be told to eat less protein. Stay at a healthy weight. Where to find more information National Kidney Foundation (NKF): kidney.org [facility]): urologyhealth.org Contact a doctor if: You have  pain that gets worse or does not get better with medicine. Get help right away if: You have a fever or chills. You get very bad pain. You get new pain in your belly. You faint. You cannot pee. This information is not intended to replace advice given to you by your health care provider. Make sure you discuss any questions you have with your health care provider. Document Revised: 04/22/2022 Document Reviewed: 04/22/2022 Elsevier Patient Education  2024 Elsevier Inc. Laser Therapy for Kidney Stones, Care After After laser therapy for kidney stones, it is common to have: Pain. A burning feeling when you pee (urinate). Small amounts of blood in your pee (urine). A need to pee a lot. Parts of the kidney stone in your pee. Mild discomfort in your back when you pee. You may have this if you had  a small mesh tube (stent) placed during the procedure. Follow these instructions at home: Medicines Take over-the-counter and prescription medicines only as told by your health care provider. If you were prescribed antibiotics, take them as told by your provider. Do not stop using the antibiotic even if you start to feel better. Ask your provider if the medicine prescribed to you: Requires you to avoid driving or using machinery. Can cause constipation. You may need to take these actions to prevent or treat constipation: Drink enough fluid to keep your pee pale yellow. Take over-the-counter or prescription medicines. Eat foods that are high in fiber, such as beans, whole grains, and fresh fruits and vegetables. Limit foods that are high in fat and processed sugars, such as fried or sweet foods. Activity  If you were given a sedative during the procedure, it can affect you for several hours. Do not drive or operate machinery until your provider says that it is safe. Return to your normal activities as told by your provider. Ask your provider what activities are safe for you. General  instructions Your provider may recommend that you drink a lot of water for a few hours after your procedure. If you have heart or kidney disease, ask your provider how much you should drink. You may be asked to strain your pee to collect any stone pieces that you pass. Your provider may have these pieces tested. Do not take baths, swim, or use a hot tub until your provider approves. Ask your provider if you may take warm baths to soothe the burning. Keep all follow-up visits. If you have a stent, you will need to go back to your provider to have it removed. Your provider may give you more instructions. Make sure you know what you can and cannot do. Contact a health care provider if: You have pain or a burning feeling that lasts for more than 2 days. You feel nauseous. You vomit more and more often. You have trouble peeing. You have pain that gets worse or does not get better with medicine. You have a fever or shaking chills. Get help right away if: You cannot pee, even when your bladder feels full. You faint. You have chest pain, shortness of breath, or cough up blood. You have: Bright red blood or blood clots in your pee. Severe pain or discomfort. Pain in your abdomen. Swelling in your legs. These symptoms may be an emergency. Get help right away. Call 911. Do not wait to see if the symptoms will go away. Do not drive yourself to the hospital. This information is not intended to replace advice given to you by your health care provider. Make sure you discuss any questions you have with your health care provider. Document Revised: 04/29/2022 Document Reviewed: 04/29/2022 Elsevier Patient Education  2024 [employer] Follow Up Instructions: Return if symptoms worsen or fail to improve.    I discussed the assessment and treatment plan with the patient. The patient was provided an opportunity to ask questions and all were answered. The patient agreed with the plan and demonstrated an  understanding of the instructions.   The patient was advised to call back or seek an in-person evaluation if the symptoms worsen or if the condition fails to improve as anticipated.  The above assessment and management plan was discussed with the patient. The patient verbalized understanding of and has agreed to the management plan. Patient is aware to call the clinic if they develop any new symptoms or if  symptoms persist or worsen. Patient is aware when to return to the clinic for a follow-up visit. Patient educated on when it is appropriate to go to the emergency department.    I provided 12 minutes of time during this video encounter.   Shaneika Rossa [place] Thompson, DNP [facility] [address] [place], [place] [address] [phone] 06/19/2024

## 2024-06-20 ENCOUNTER — Ambulatory Visit: Payer: Self-pay | Admitting: Nurse Practitioner

## 2024-06-20 DIAGNOSIS — R10A3 Flank pain, bilateral: Secondary | ICD-10-CM | POA: Insufficient documentation

## 2024-06-20 DIAGNOSIS — N200 Calculus of kidney: Secondary | ICD-10-CM | POA: Insufficient documentation

## 2024-06-20 LAB — MICROSCOPIC EXAMINATION
Bacteria, UA: NONE SEEN
Mucus, UA: NONE SEEN
Renal Epithel, UA: NONE SEEN /HPF
WBC, UA: NONE SEEN /HPF (ref 0–5)

## 2024-06-20 LAB — URINALYSIS, ROUTINE W REFLEX MICROSCOPIC
Bilirubin, UA: NEGATIVE
Glucose, UA: NEGATIVE
Ketones, UA: NEGATIVE
Leukocytes,UA: NEGATIVE
Nitrite, UA: NEGATIVE
Protein,UA: NEGATIVE
Specific Gravity, UA: 1.015 (ref 1.005–1.030)
Urobilinogen, Ur: 0.2 mg/dL (ref 0.2–1.0)
pH, UA: 5 (ref 5.0–7.5)

## 2024-06-20 MED ORDER — TAMSULOSIN HCL 0.4 MG PO CAPS: 0.4000 mg | ORAL_CAPSULE | Freq: Every day | ORAL | 0 refills | Status: DC

## 2024-06-20 MED ORDER — KETOROLAC TROMETHAMINE 10 MG PO TABS: 10.0000 mg | ORAL_TABLET | Freq: Four times a day (QID) | ORAL | 0 refills | Status: DC | PRN

## 2024-06-24 NOTE — Telephone Encounter (Signed)
 We hav eto see her to do KUB in office before we can order ct scan

## 2024-06-25 ENCOUNTER — Encounter: Payer: Self-pay | Admitting: Nurse Practitioner

## 2024-06-25 ENCOUNTER — Ambulatory Visit: Admitting: Nurse Practitioner

## 2024-06-25 VITALS — BP 120/81 | HR 101 | Temp 98.2°F | Ht 65.0 in | Wt 218.0 lb

## 2024-06-25 DIAGNOSIS — R109 Unspecified abdominal pain: Secondary | ICD-10-CM

## 2024-06-25 DIAGNOSIS — R300 Dysuria: Secondary | ICD-10-CM

## 2024-06-25 LAB — URINALYSIS, ROUTINE W REFLEX MICROSCOPIC
Bilirubin, UA: NEGATIVE
Glucose, UA: NEGATIVE
Ketones, UA: NEGATIVE
Leukocytes,UA: NEGATIVE
Nitrite, UA: NEGATIVE
Protein,UA: NEGATIVE
RBC, UA: NEGATIVE
Specific Gravity, UA: 1.01 (ref 1.005–1.030)
Urobilinogen, Ur: 0.2 mg/dL (ref 0.2–1.0)
pH, UA: 6 (ref 5.0–7.5)

## 2024-06-25 MED ORDER — NAPROXEN 500 MG PO TABS: 500.0000 mg | ORAL_TABLET | Freq: Two times a day (BID) | ORAL | 1 refills | Status: AC

## 2024-06-25 NOTE — Telephone Encounter (Signed)
 Patient was seen in office today.

## 2024-06-25 NOTE — Progress Notes (Signed)
   Subjective:    Patient ID: Mia Rodgers, female    DOB: 1986/09/01, 38 y.o.   MRN: [id number]   Chief Complaint: Dysuria   Dysuria     Patient had telehealth visit with S. Thompson,FNP with dysuria. She brought a urine in and she had crystals in her urine and was told that she had kidney stone. She was prescribed flomax and she has taken 1 dose. She has seen no blood in urine. Has back pain which is normal for her. She is having slight suprapubic cramping rating 3/10.  Patient Active Problem List   Diagnosis Date Noted   Renal calculi 06/20/2024   Bilateral flank pain 06/20/2024   Dysuria 06/19/2024   BMI 35.0-35.9,adult 05/28/2024   Asthma    GAD (generalized anxiety disorder) 05/02/2023        Review of Systems  Constitutional:  Negative for diaphoresis.  Eyes:  Negative for pain.  Respiratory:  Negative for shortness of breath.   Cardiovascular:  Negative for chest pain, palpitations and leg swelling.  Gastrointestinal:  Negative for abdominal pain.  Endocrine: Negative for polydipsia.  Genitourinary:  Positive for dysuria.  Skin:  Negative for rash.  Neurological:  Negative for dizziness, weakness and headaches.  Hematological:  Does not bruise/bleed easily.  All other systems reviewed and are negative.      Objective:   Physical Exam Constitutional:      Appearance: Normal appearance.  Cardiovascular:     Rate and Rhythm: Normal rate and regular rhythm.     Heart sounds: Normal heart sounds.  Pulmonary:     Breath sounds: Normal breath sounds.  Abdominal:     Tenderness: There is no right CVA tenderness or left CVA tenderness.  Skin:    General: Skin is warm.  Neurological:     General: No focal deficit present.     Mental Status: She is alert and oriented to person, place, and time.  Psychiatric:        Mood and Affect: Mood normal.        Behavior: Behavior normal.     BP 120/81   Pulse (!) 101   Temp 98.2 F (36.8 C) (Temporal)   Ht 5' 5  (1.651 m)   Wt 218 lb (98.9 kg)   SpO2 96%   BMI 36.28 kg/m        Assessment & Plan:   Mia Rodgers in today with chief complaint of Dysuria   1. Dysuria (Primary) - Urinalysis, Routine w reflex microscopic - Urine Culture  2. Abdominal cramping Moist heat rest - naproxen (NAPROSYN) 500 MG tablet; Take 1 tablet (500 mg total) by mouth 2 (two) times daily with a meal.  Dispense: 60 tablet; Refill: 1    The above assessment and management plan was discussed with the patient. The patient verbalized understanding of and has agreed to the management plan. Patient is aware to call the clinic if symptoms persist or worsen. Patient is aware when to return to the clinic for a follow-up visit. Patient educated on when it is appropriate to go to the emergency department.   Mary-Margaret Gladis, FNP

## 2024-06-27 ENCOUNTER — Ambulatory Visit: Payer: Self-pay | Admitting: Nurse Practitioner

## 2024-06-27 LAB — URINE CULTURE

## 2024-06-28 DIAGNOSIS — E0590 Thyrotoxicosis, unspecified without thyrotoxic crisis or storm: Secondary | ICD-10-CM

## 2024-06-28 MED ORDER — SULFAMETHOXAZOLE-TRIMETHOPRIM 800-160 MG PO TABS: 1.0000 | ORAL_TABLET | Freq: Two times a day (BID) | ORAL | 0 refills | Status: DC

## 2024-06-28 NOTE — Addendum Note (Signed)
 Addended by: GLADIS MUSTARD on: 06/28/2024 04:52 PM   Modules accepted: Orders

## 2024-07-21 ENCOUNTER — Emergency Department (HOSPITAL_BASED_OUTPATIENT_CLINIC_OR_DEPARTMENT_OTHER): Admitting: Radiology

## 2024-07-21 ENCOUNTER — Encounter (HOSPITAL_BASED_OUTPATIENT_CLINIC_OR_DEPARTMENT_OTHER): Payer: Self-pay

## 2024-07-21 ENCOUNTER — Emergency Department (HOSPITAL_BASED_OUTPATIENT_CLINIC_OR_DEPARTMENT_OTHER)
Admission: EM | Admit: 2024-07-21 | Discharge: 2024-07-21 | Disposition: A | Discharge status: Home / Self Care | Attending: Emergency Medicine | Admitting: Emergency Medicine

## 2024-07-21 ENCOUNTER — Other Ambulatory Visit: Payer: Self-pay

## 2024-07-21 DIAGNOSIS — J45909 Unspecified asthma, uncomplicated: Secondary | ICD-10-CM | POA: Insufficient documentation

## 2024-07-21 DIAGNOSIS — D72829 Elevated white blood cell count, unspecified: Secondary | ICD-10-CM | POA: Insufficient documentation

## 2024-07-21 DIAGNOSIS — R42 Dizziness and giddiness: Secondary | ICD-10-CM | POA: Insufficient documentation

## 2024-07-21 DIAGNOSIS — R0789 Other chest pain: Secondary | ICD-10-CM | POA: Diagnosis not present

## 2024-07-21 LAB — TROPONIN T, HIGH SENSITIVITY
Troponin T High Sensitivity: <15 ng/L (ref 0–19)
Troponin T High Sensitivity: <15 ng/L (ref 0–19)

## 2024-07-21 LAB — URINALYSIS, ROUTINE W REFLEX MICROSCOPIC
Bilirubin Urine: NEGATIVE
Glucose, UA: NEGATIVE mg/dL
Hgb urine dipstick: NEGATIVE
Ketones, ur: NEGATIVE mg/dL
Leukocytes,Ua: NEGATIVE
Nitrite: NEGATIVE
Protein, ur: NEGATIVE mg/dL
Specific Gravity, Urine: <1.005 — ABNORMAL LOW (ref 1.005–1.030)
pH: 7 (ref 5.0–8.0)

## 2024-07-21 LAB — CBC
HCT: 44.1 % (ref 36.0–46.0)
Hemoglobin: 15.1 g/dL — ABNORMAL HIGH (ref 12.0–15.0)
MCH: 30.6 pg (ref 26.0–34.0)
MCHC: 34.2 g/dL (ref 30.0–36.0)
MCV: 89.5 fL (ref 80.0–100.0)
Platelets: 310 K/uL (ref 150–400)
RBC: 4.93 MIL/uL (ref 3.87–5.11)
RDW: 13.6 % (ref 11.5–15.5)
WBC: 14.1 K/uL — ABNORMAL HIGH (ref 4.0–10.5)
nRBC: 0 % (ref 0.0–0.2)

## 2024-07-21 LAB — BASIC METABOLIC PANEL WITH GFR
Anion gap: 13 (ref 5–15)
BUN: 9 mg/dL (ref 6–20)
CO2: 23 mmol/L (ref 22–32)
Calcium: 9.6 mg/dL (ref 8.9–10.3)
Chloride: 103 mmol/L (ref 98–111)
Creatinine, Ser: 0.72 mg/dL (ref 0.44–1.00)
GFR, Estimated: >60 mL/min (ref >60)
Glucose, Bld: 90 mg/dL (ref 70–99)
Potassium: 4 mmol/L (ref 3.5–5.1)
Sodium: 139 mmol/L (ref 135–145)

## 2024-07-21 LAB — HEPATIC FUNCTION PANEL
ALT: 11 U/L (ref 0–44)
AST: 16 U/L (ref 15–41)
Albumin: 4.3 g/dL (ref 3.5–5.0)
Alkaline Phosphatase: 68 U/L (ref 38–126)
Bilirubin, Direct: <0.1 mg/dL (ref 0.0–0.2)
Total Bilirubin: 0.3 mg/dL (ref 0.0–1.2)
Total Protein: 7 g/dL (ref 6.5–8.1)

## 2024-07-21 LAB — D-DIMER, QUANTITATIVE: D-Dimer, Quant: 0.3 ug{FEU}/mL (ref 0.00–0.50)

## 2024-07-21 LAB — PREGNANCY, URINE: Preg Test, Ur: NEGATIVE

## 2024-07-21 MED ORDER — MECLIZINE HCL 25 MG PO TABS: 25.0000 mg | ORAL_TABLET | Freq: Three times a day (TID) | ORAL | 0 refills | Status: AC | PRN

## 2024-07-21 MED ORDER — SODIUM CHLORIDE 0.9 % IV BOLUS
1000.0000 mL | Freq: Once | INTRAVENOUS | Status: AC
Start: 2024-07-21 — End: 2024-07-21
  Administered 2024-07-21: 1000 mL via INTRAVENOUS

## 2024-07-21 MED ORDER — KETOROLAC TROMETHAMINE 30 MG/ML IJ SOLN
30.0000 mg | Freq: Once | INTRAMUSCULAR | Status: DC
  Filled 2024-07-21: qty 1

## 2024-07-21 MED ORDER — MECLIZINE HCL 25 MG PO TABS
25.0000 mg | ORAL_TABLET | Freq: Once | ORAL | Status: AC
  Administered 2024-07-21: 25 mg via ORAL
  Filled 2024-07-21: qty 1

## 2024-07-21 NOTE — ED Triage Notes (Signed)
 Pt reports light-headedness x2 days ago for 3 hours. Pt reports she started having light-headedness/dizzy this AM along with chest pain.

## 2024-07-21 NOTE — ED Provider Notes (Signed)
 [place] EMERGENCY DEPARTMENT AT [facility] Provider Note   CSN: [id number] Arrival date & time: 07/21/24  9061     Patient presents with: Dizziness and Chest Pain   Mia Rodgers is a 38 y.o. female.   Pt is a 39 yo female with pmhx significant for hypertriglyceridemia (no meds yet), obesity and asthma.  Pt said she has been intermittently light headed for 2 days.  She's also had some left sided cp.  Cp is gone now.  Dizziness is mostly gone.  She said the dizziness is worse with standing or moving.  Her mom died at age 22 from CHF and she's worried whenever she has cp.  Pt denies sob.  No n/v.  No f/c.       Prior to Admission medications   Medication Sig Start Date End Date Taking? Authorizing Provider  meclizine (ANTIVERT) 25 MG tablet Take 1 tablet (25 mg total) by mouth 3 (three) times daily as needed for dizziness. 07/21/24  Yes Dean Clarity, MD  albuterol  (VENTOLIN  HFA) 108 [phone] Base) MCG/ACT inhaler USE 2 PUFFS EVERY 6 HOURS AS NEEDED 06/18/24   Gladis, Mary-Margaret, FNP  ALPRAZolam  (XANAX ) 1 MG tablet Take 1 tablet (1 mg total) by mouth 2 (two) times daily. 02/23/24   Gladis Mary-Margaret, FNP  cetirizine (ZYRTEC) 10 MG tablet Take 10 mg by mouth daily.    [provider]  ketorolac (TORADOL) 10 MG tablet Take 1 tablet (10 mg total) by mouth every 6 (six) hours as needed. 06/20/24   [place] Sebastian Pool, NP  naproxen (NAPROSYN) 500 MG tablet Take 1 tablet (500 mg total) by mouth 2 (two) times daily with a meal. 06/25/24   Gladis, Mary-Margaret, FNP  ondansetron  (ZOFRAN ) 4 MG tablet Take 1 tablet (4 mg total) by mouth every 8 (eight) hours as needed for nausea or vomiting. 11/07/23   Rakes, Rock HERO, FNP  sulfamethoxazole-trimethoprim (BACTRIM DS) 800-160 MG tablet Take 1 tablet by mouth 2 (two) times daily. 06/28/24   Gladis, Mary-Margaret, FNP  tamsulosin (FLOMAX) 0.4 MG CAPS capsule Take 1 capsule (0.4 mg total) by mouth daily. 06/20/24   [facility] [place] Sebastian Pool, NP  tirzepatide  (ZEPBOUND ) 2.5 MG/0.5ML Pen Inject 2.5 mg into the skin once a week. Patient not taking: Reported on 06/25/2024 05/28/24   Gladis Mustard, FNP    Allergies: Hydrocodone and Hydrocodone-acetaminophen    Review of Systems  Cardiovascular:  Positive for chest pain.  Neurological:  Positive for dizziness.  All other systems reviewed and are negative.   Updated Vital Signs Temp 98.7 F (37.1 C) (Oral)   Ht 5' 5 (1.651 m)   Wt 98.4 kg   LMP 07/01/2024   BMI 36.11 kg/m   Physical Exam Vitals and nursing note reviewed.  Constitutional:      Appearance: She is well-developed. She is obese.  HENT:     Head: Normocephalic and atraumatic.  Eyes:     Extraocular Movements: Extraocular movements intact.     Pupils: Pupils are equal, round, and reactive to light.  Cardiovascular:     Rate and Rhythm: Normal rate and regular rhythm.     Heart sounds: Normal heart sounds.  Pulmonary:     Effort: Pulmonary effort is normal.     Breath sounds: Normal breath sounds.  Abdominal:     General: Bowel sounds are normal.     Palpations: Abdomen is soft.  Musculoskeletal:        General: Normal range of motion.  Cervical back: Normal range of motion and neck supple.  Skin:    General: Skin is warm.     Capillary Refill: Capillary refill takes less than 2 seconds.  Neurological:     General: No focal deficit present.     Mental Status: She is alert and oriented to person, place, and time.  Psychiatric:        Mood and Affect: Mood normal.        Behavior: Behavior normal.     (all labs ordered are listed, but only abnormal results are displayed) Labs Reviewed  CBC - Abnormal; Notable for the following components:      Result Value   WBC 14.1 (*)    Hemoglobin 15.1 (*)    All other components within normal limits  URINALYSIS, ROUTINE W REFLEX MICROSCOPIC - Abnormal; Notable for the following components:   Color, Urine COLORLESS (*)     Specific Gravity, Urine <1.005 (*)    All other components within normal limits  BASIC METABOLIC PANEL WITH GFR  PREGNANCY, URINE  D-DIMER, QUANTITATIVE  HEPATIC FUNCTION PANEL  TROPONIN T, HIGH SENSITIVITY  TROPONIN T, HIGH SENSITIVITY    EKG: EKG Interpretation Date/Time:  Sunday July 21 2024 09:46:39 EST Ventricular Rate:  96 PR Interval:  151 QRS Duration:  95 QT Interval:  355 QTC Calculation: 449 R Axis:   72  Text Interpretation: Sinus rhythm No significant change since last tracing Confirmed by Dean Clarity [id number]) on 07/21/2024 9:51:59 AM  Radiology: [facility] Chest 2 View Result Date: 07/21/2024 EXAM: 2 VIEW(S) XRAY OF THE CHEST 07/21/2024 10:23:00 AM COMPARISON: 09/21/18 . CLINICAL HISTORY: chest pain FINDINGS: LUNGS AND PLEURA: No focal pulmonary opacity. No pulmonary edema. No pleural effusion. No pneumothorax. HEART AND MEDIASTINUM: No acute abnormality of the cardiac and mediastinal silhouettes. BONES AND SOFT TISSUES: No acute osseous abnormality. IMPRESSION: 1. No acute cardiopulmonary process. Electronically signed by: Waddell Calk MD 07/21/2024 10:41 AM EST RP Workstation: [id number]     Procedures   Medications Ordered in the ED  sodium chloride 0.9 % bolus 1,000 mL (0 mLs Intravenous Stopped 07/21/24 1116)  meclizine (ANTIVERT) tablet 25 mg (25 mg Oral Given 07/21/24 1025)                                    Medical Decision Making Amount and/or Complexity of Data Reviewed Labs: ordered. Radiology: ordered.  Risk Prescription drug management.   This patient presents to the ED for concern of dizziness/cp, this involves an extensive number of treatment options, and is a complaint that carries with it a high risk of complications and morbidity.  The differential diagnosis includes vertigo, cardiac, pulm, gi   Co morbidities that complicate the patient evaluation  hypertriglyceridemia (no meds yet), obesity and asthma   Additional history  obtained:  Additional history obtained from epic chart review External records from outside source obtained and reviewed including husband   Lab Tests:  I Ordered, and personally interpreted labs.  The pertinent results include:  cbc with wbc elevated at 14.1; ua neg; preg neg; trop neg times 2; cmp nl   Imaging Studies ordered:  I ordered imaging studies including cxr  I independently visualized and interpreted imaging which showed No acute cardiopulmonary process.  I agree with the radiologist interpretation   Cardiac Monitoring:  The patient was maintained on a cardiac monitor.  I personally viewed and interpreted the cardiac monitored  which showed an underlying rhythm of: nsr   Medicines ordered and prescription drug management:  I ordered medication including ivfs/antivert  for sx  Reevaluation of the patient after these medicines showed that the patient improved I have reviewed the patients home medicines and have made adjustments as needed   Test Considered:  ct   Problem List / ED Course:  Cp:  atypical.  Cardiac eval neg.  Ddimer neg.  Pt does have a hx of elevated tg and cholesterol.  She is due for a recheck in Dec.  She is given a diet to try improve her numbers. Dizziness:  likely vertigo.  Sx improved after antivert.   Reevaluation:  After the interventions noted above, I reevaluated the patient and found that they have :improved   Social Determinants of Health:  Lives at home   Dispostion:  After consideration of the diagnostic results and the patients response to treatment, I feel that the patent would benefit from discharge with outpatient f/u.   Return if worse.     Final diagnoses:  Atypical chest pain  Vertigo    ED Discharge Orders          Ordered    meclizine (ANTIVERT) 25 MG tablet  3 times daily PRN        07/21/24 1304               Dean Clarity, MD 07/21/24 1307

## 2024-08-07 ENCOUNTER — Other Ambulatory Visit: Payer: Self-pay | Admitting: Nurse Practitioner

## 2024-08-07 DIAGNOSIS — J4520 Mild intermittent asthma, uncomplicated: Secondary | ICD-10-CM

## 2024-08-15 ENCOUNTER — Encounter: Payer: Self-pay | Admitting: Nurse Practitioner

## 2024-08-15 ENCOUNTER — Ambulatory Visit: Admitting: Nurse Practitioner

## 2024-08-15 VITALS — BP 116/79 | HR 93 | Temp 98.2°F | Ht 65.0 in | Wt 220.0 lb

## 2024-08-15 DIAGNOSIS — R42 Dizziness and giddiness: Secondary | ICD-10-CM

## 2024-08-15 DIAGNOSIS — F410 Panic disorder [episodic paroxysmal anxiety] without agoraphobia: Secondary | ICD-10-CM

## 2024-08-15 MED ORDER — PREDNISONE 10 MG (21) PO TBPK: ORAL_TABLET | ORAL | 0 refills | Status: DC

## 2024-08-15 NOTE — Progress Notes (Signed)
   Subjective:    Patient ID: Mia Rodgers, female    DOB: 12-23-1985, 38 y.o.   MRN: [id number]   Chief Complaint: Dizziness (High Stepping, Nervous driving. Started 2 weeks ago and no better. Was seen at ER)   Dizziness Pertinent negatives include no abdominal pain, chest pain, diaphoresis, headaches, rash or weakness.    Patient comes in c/o what she thought was vertigo. Has been going on for a month. Went to ED and was given meclizine . Did not help. Has continued to have what she states is dizziness. Makes her very anxious. She gets very anxious when she is driving because she is afraid she is going  to get dizzy when she is driving. Episodes have been occurring daily for about a week, lasting 30-2-3 hours.  Patient Active Problem List   Diagnosis Date Noted   Renal calculi 06/20/2024   Bilateral flank pain 06/20/2024   Dysuria 06/19/2024   BMI 35.0-35.9,adult 05/28/2024   Asthma    GAD (generalized anxiety disorder) 05/02/2023       Review of Systems  Constitutional:  Negative for diaphoresis.  Eyes:  Negative for pain.  Respiratory:  Negative for shortness of breath.   Cardiovascular:  Negative for chest pain, palpitations and leg swelling.  Gastrointestinal:  Negative for abdominal pain.  Endocrine: Negative for polydipsia.  Skin:  Negative for rash.  Neurological:  Positive for dizziness. Negative for weakness and headaches.  Hematological:  Does not bruise/bleed easily.  All other systems reviewed and are negative.      Objective:   Physical Exam Constitutional:      Appearance: Normal appearance.  HENT:     Right Ear: Tympanic membrane normal.     Left Ear: Tympanic membrane normal.  Cardiovascular:     Rate and Rhythm: Normal rate and regular rhythm.     Heart sounds: Normal heart sounds.  Pulmonary:     Effort: Pulmonary effort is normal.     Breath sounds: Normal breath sounds.  Neurological:     General: No focal deficit present.     Mental Status:  She is alert and oriented to person, place, and time.     Cranial Nerves: No cranial nerve deficit.     Sensory: No sensory deficit.     BP 116/79   Pulse 93   Temp 98.2 F (36.8 C) (Temporal)   Ht 5' 5 (1.651 m)   Wt 220 lb (99.8 kg)   LMP 07/01/2024   BMI 36.61 kg/m        Assessment & Plan:  [name] in today with chief complaint of Dizziness (High Stepping, Nervous driving. Started 2 weeks ago and no better. Was seen at ER)   1. Vertigo (Primary) Force fluids Meclizine  as needed Bonine if meclizine  makes you sleepy No driving until episodes subside RTO prn - predniSONE  (STERAPRED UNI-PAK 21 TAB) 10 MG (21) TBPK tablet; As directed x 6 days  Dispense: 21 tablet; Refill: 0  2. Panic attacks Deep breathing exercises.     The above assessment and management plan was discussed with the patient. The patient verbalized understanding of and has agreed to the management plan. Patient is aware to call the clinic if symptoms persist or worsen. Patient is aware when to return to the clinic for a follow-up visit. Patient educated on when it is appropriate to go to the emergency department.   Mary-Margaret Gladis, FNP

## 2024-08-15 NOTE — Patient Instructions (Signed)
 Vertigo Vertigo is the feeling that you or the things around you are moving or spinning when they're not. It's different than feeling dizzy. It can also cause: Loss of balance. Trouble standing or walking. Nausea and vomiting. This feeling can come and go at any time. It can last from a few seconds to minutes or even hours. It may go away on its own or be treated with medicine. What are the types of vertigo? There are two types of vertigo: Peripheral vertigo happens when parts of your inner ear don't work like they should. This is the more common type. Central vertigo happens when your brain and spinal cord don't work like they should. Your health care provider will do tests to find out what kind of vertigo you have. This will help them decide on the right treatment for you. Follow these instructions at home: Eating and drinking Drink enough fluid to keep your pee (urine) pale yellow. Do not drink alcohol. Activity When you get up in the morning, first sit up on the side of the bed. When you feel okay, stand slowly while holding onto something. Move slowly. Avoid sudden body or head movements. Avoid certain positions, as told by your provider. Use a cane if you have trouble standing or walking. Sit down right away if you feel unsteady. Place items in your home so they're easy for you to reach without bending or leaning over. Return to normal activities when you're told. Ask what things are safe for you to do. General instructions Take your medicines only as told by your provider. Contact a health care provider if: Your medicines don't help or make your vertigo worse. You get new symptoms. You have a fever. You have nausea or vomiting. Your family or friends spot any changes in how you're acting. A part of your body goes numb. You feel tingling and prickling in a part of your body. You get very bad headaches. Get help right away if: You're always dizzy or you faint. You have a  stiff neck. You have trouble moving or speaking. Your hands, arms, or legs feel weak. Your hearing or eyesight changes. These symptoms may be an emergency. Call 911 right away. Do not wait to see if the symptoms will go away. Do not drive yourself to the hospital. This information is not intended to replace advice given to you by your health care provider. Make sure you discuss any questions you have with your health care provider. Document Revised: 06/01/2023 Document Reviewed: 12/02/2022 Elsevier Patient Education  2024 [employer].

## 2024-08-20 NOTE — Telephone Encounter (Signed)
 Need stat referral to ENT for vertigo- where would she like to go. Please do referral stat once talk with patient

## 2024-08-22 ENCOUNTER — Encounter (INDEPENDENT_AMBULATORY_CARE_PROVIDER_SITE_OTHER): Payer: Self-pay

## 2024-08-27 ENCOUNTER — Ambulatory Visit: Payer: Self-pay | Admitting: Nurse Practitioner

## 2024-08-29 ENCOUNTER — Encounter: Payer: Self-pay | Admitting: Nurse Practitioner

## 2024-08-29 ENCOUNTER — Ambulatory Visit: Payer: Self-pay | Admitting: Nurse Practitioner

## 2024-09-13 ENCOUNTER — Other Ambulatory Visit: Payer: Self-pay | Admitting: Nurse Practitioner

## 2024-09-13 DIAGNOSIS — J4520 Mild intermittent asthma, uncomplicated: Secondary | ICD-10-CM

## 2024-09-17 ENCOUNTER — Ambulatory Visit: Admitting: Nurse Practitioner

## 2024-09-17 ENCOUNTER — Other Ambulatory Visit (HOSPITAL_COMMUNITY)
Admission: RE | Admit: 2024-09-17 | Discharge: 2024-09-17 | Disposition: A | Discharge status: Home / Self Care | Source: Ambulatory Visit | Attending: Nurse Practitioner | Admitting: Nurse Practitioner

## 2024-09-17 VITALS — BP 104/69 | HR 95 | Temp 97.1°F | Ht 65.0 in | Wt 223.0 lb

## 2024-09-17 DIAGNOSIS — J4521 Mild intermittent asthma with (acute) exacerbation: Secondary | ICD-10-CM | POA: Diagnosis not present

## 2024-09-17 DIAGNOSIS — Z0000 Encounter for general adult medical examination without abnormal findings: Secondary | ICD-10-CM | POA: Insufficient documentation

## 2024-09-17 DIAGNOSIS — Z6835 Body mass index (BMI) 35.0-35.9, adult: Secondary | ICD-10-CM | POA: Diagnosis not present

## 2024-09-17 DIAGNOSIS — F411 Generalized anxiety disorder: Secondary | ICD-10-CM | POA: Diagnosis not present

## 2024-09-17 MED ORDER — ALPRAZOLAM 1 MG PO TABS: 1.0000 mg | ORAL_TABLET | Freq: Two times a day (BID) | ORAL | 5 refills | Status: AC

## 2024-09-17 NOTE — Progress Notes (Signed)
 "  Subjective:    Patient ID: Mia Rodgers, female    DOB: March 19, 1986, 39 y.o.   MRN: [id number]   Chief Complaint: annual physical   HPI:  Mia Rodgers is a 39 y.o. who identifies as a female who was assigned female at birth.   Social history: Lives with: husband and kids Work history: merchandiser, retail   Comes in today for follow up of the following chronic medical issues:  1. Mild intermittent asthma with acute exacerbation Has occasional flare ups. Usually when she is sick  2. GAD (generalized anxiety disorder) Has long history of anxiety. Has been on xanax  3x a day for years. Still has occasional issues.    09/17/2024    8:28 AM 08/15/2024   11:59 AM 06/25/2024    8:18 AM 05/28/2024    8:37 AM  GAD 7 : Generalized Anxiety Score  Nervous, Anxious, on Edge 1 3 1 1   Control/stop worrying 1 3 1 1   Worry too much - different things 1 3 1 1   Trouble relaxing 1 3 1 1   Restless 1 3 1 1   Easily annoyed or irritable 1 3 1 1   Afraid - awful might happen 1 3 1 1   Total GAD 7 Score 7 21 7 7   Anxiety Difficulty Somewhat difficult Somewhat difficult Somewhat difficult Somewhat difficult      3. BMI 35.0-35.9,adult She was precribed zepbound  but never got it.  Wt Readings from Last 3 Encounters:  09/17/24 223 lb (101.2 kg)  08/15/24 220 lb (99.8 kg)  07/21/24 217 lb (98.4 kg)   BMI Readings from Last 3 Encounters:  09/17/24 37.11 kg/m  08/15/24 36.61 kg/m  07/21/24 36.11 kg/m      New complaints: Was having issues with dizziness. Was occurring when she was driving and would make her so anxious that she would have a panic attack. Has nit had any recnt episodes.  Allergies[1] Outpatient Encounter Medications as of 09/17/2024  Medication Sig   albuterol  (VENTOLIN  HFA) 108 (90 Base) MCG/ACT inhaler USE 2 PUFFS EVERY 6 HOURS AS NEEDED   ALPRAZolam  (XANAX ) 1 MG tablet Take 1 tablet (1 mg total) by mouth 2 (two) times daily.   cetirizine (ZYRTEC) 10 MG tablet Take 10 mg by  mouth daily.   meclizine  (ANTIVERT ) 25 MG tablet Take 1 tablet (25 mg total) by mouth 3 (three) times daily as needed for dizziness.   naproxen  (NAPROSYN ) 500 MG tablet Take 1 tablet (500 mg total) by mouth 2 (two) times daily with a meal.   ondansetron  (ZOFRAN ) 4 MG tablet Take 1 tablet (4 mg total) by mouth every 8 (eight) hours as needed for nausea or vomiting.   predniSONE  (STERAPRED UNI-PAK 21 TAB) 10 MG (21) TBPK tablet As directed x 6 days   tirzepatide  (ZEPBOUND ) 2.5 MG/0.5ML Pen Inject 2.5 mg into the skin once a week.   No facility-administered encounter medications on file as of 09/17/2024.    Past Surgical History:  Procedure Laterality Date   CESAREAN SECTION     TONSILLECTOMY      No family history on file.    Controlled substance contract: n/a      Review of Systems  Constitutional:  Negative for diaphoresis.  Eyes:  Negative for pain.  Respiratory:  Negative for shortness of breath.   Cardiovascular:  Negative for chest pain, palpitations and leg swelling.  Gastrointestinal:  Negative for abdominal pain.  Endocrine: Negative for polydipsia.  Skin:  Negative for rash.  Neurological:  Negative for dizziness, weakness and headaches.  Hematological:  Does not bruise/bleed easily.  All other systems reviewed and are negative.      Objective:   Physical Exam Vitals and nursing note reviewed.  Constitutional:      General: She is not in acute distress.    Appearance: Normal appearance. She is well-developed.  HENT:     Head: Normocephalic.     Right Ear: Tympanic membrane normal.     Left Ear: Tympanic membrane normal.     Nose: Nose normal.     Mouth/Throat:     Mouth: Mucous membranes are moist.  Eyes:     Pupils: Pupils are equal, round, and reactive to light.  Neck:     Vascular: No carotid bruit or JVD.  Cardiovascular:     Rate and Rhythm: Normal rate and regular rhythm.     Heart sounds: Normal heart sounds.  Pulmonary:     Effort: Pulmonary  effort is normal. No respiratory distress.     Breath sounds: Normal breath sounds. No wheezing or rales.  Chest:     Chest wall: No tenderness.  Abdominal:     General: Bowel sounds are normal. There is no distension or abdominal bruit.     Palpations: Abdomen is soft. There is no hepatomegaly, splenomegaly, mass or pulsatile mass.     Tenderness: There is no abdominal tenderness.  Genitourinary:    General: Normal vulva.     Vagina: No vaginal discharge.     Rectum: Normal.     Comments: Cervix parous and pink No adnexal masses or tenderness Musculoskeletal:        General: Normal range of motion.     Cervical back: Normal range of motion and neck supple.  Lymphadenopathy:     Cervical: No cervical adenopathy.  Skin:    General: Skin is warm and dry.  Neurological:     Mental Status: She is alert and oriented to person, place, and time.     Deep Tendon Reflexes: Reflexes are normal and symmetric.  Psychiatric:        Behavior: Behavior normal.        Thought Content: Thought content normal.        Judgment: Judgment normal.    BP 104/69   Pulse 95   Temp (!) 97.1 F (36.2 C) (Temporal)   Ht 5' 5 (1.651 m)   Wt 223 lb (101.2 kg)   SpO2 96%   BMI 37.11 kg/m         Assessment & Plan:   Mia Rodgers in today with chief complaint of Annual Exam   1. Mild intermittent asthma with acute exacerbation (Primary) Albuterol  as needed  2. GAD (generalized anxiety disorder) Stress management - ALPRAZolam  (XANAX ) 1 MG tablet; Take 1 tablet (1 mg total) by mouth 2 (two) times daily.  Dispense: 60 tablet; Refill: 5  3. BMI 35.0-35.9,adult Discussed diet and exercise for person with BMI >25 Will recheck weight in 3-6 months   4. Annual physical exam - CBC with Differential/Platelet - CMP14+EGFR - Lipid panel - Thyroid  Panel With TSH - VITAMIN D  25 Hydroxy (Vit-D Deficiency, Fractures) - Cytology - PAP    The above assessment and management plan was discussed  with the patient. The patient verbalized understanding of and has agreed to the management plan. Patient is aware to call the clinic if symptoms persist or worsen. Patient is aware when to return to the clinic for a follow-up visit. Patient  educated on when it is appropriate to go to the emergency department.   Mary-Margaret Gladis, FNP      [1]  Allergies Allergen Reactions   Hydrocodone    Hydrocodone-Acetaminophen Nausea And Vomiting   "

## 2024-09-17 NOTE — Patient Instructions (Signed)

## 2024-09-18 ENCOUNTER — Ambulatory Visit: Payer: Self-pay | Admitting: Nurse Practitioner

## 2024-09-18 ENCOUNTER — Other Ambulatory Visit (HOSPITAL_COMMUNITY): Payer: Self-pay

## 2024-09-18 LAB — CBC WITH DIFFERENTIAL/PLATELET
Basophils Absolute: 0.1 x10E3/uL (ref 0.0–0.2)
Basos: 1 %
EOS (ABSOLUTE): 0.4 x10E3/uL (ref 0.0–0.4)
Eos: 3 %
Hematocrit: 43.9 % (ref 34.0–46.6)
Hemoglobin: 14.8 g/dL (ref 11.1–15.9)
Immature Grans (Abs): 0.1 x10E3/uL (ref 0.0–0.1)
Immature Granulocytes: 1 %
Lymphocytes Absolute: 4.3 x10E3/uL — ABNORMAL HIGH (ref 0.7–3.1)
Lymphs: 29 %
MCH: 30.2 pg (ref 26.6–33.0)
MCHC: 33.7 g/dL (ref 31.5–35.7)
MCV: 90 fL (ref 79–97)
Monocytes Absolute: 0.9 x10E3/uL (ref 0.1–0.9)
Monocytes: 6 %
Neutrophils Absolute: 9 x10E3/uL — ABNORMAL HIGH (ref 1.4–7.0)
Neutrophils: 60 %
Platelets: 352 x10E3/uL (ref 150–450)
RBC: 4.9 x10E6/uL (ref 3.77–5.28)
RDW: 13.3 % (ref 11.7–15.4)
WBC: 14.7 x10E3/uL — ABNORMAL HIGH (ref 3.4–10.8)

## 2024-09-18 LAB — CMP14+EGFR
ALT: 12 IU/L (ref 0–32)
AST: 15 IU/L (ref 0–40)
Albumin: 4.2 g/dL (ref 3.9–4.9)
Alkaline Phosphatase: 66 IU/L (ref 41–116)
BUN/Creatinine Ratio: 13 (ref 9–23)
BUN: 9 mg/dL (ref 6–20)
Bilirubin Total: 0.3 mg/dL (ref 0.0–1.2)
CO2: 22 mmol/L (ref 20–29)
Calcium: 9.3 mg/dL (ref 8.7–10.2)
Chloride: 103 mmol/L (ref 96–106)
Creatinine, Ser: 0.71 mg/dL (ref 0.57–1.00)
Globulin, Total: 2.5 g/dL (ref 1.5–4.5)
Glucose: 84 mg/dL (ref 70–99)
Potassium: 4.5 mmol/L (ref 3.5–5.2)
Sodium: 140 mmol/L (ref 134–144)
Total Protein: 6.7 g/dL (ref 6.0–8.5)
eGFR: 111 mL/min/1.73

## 2024-09-18 LAB — CYTOLOGY - PAP
Adequacy: ABSENT
Chlamydia: NEGATIVE
Comment: NEGATIVE
Comment: NEGATIVE
Comment: NORMAL
Diagnosis: NEGATIVE
Neisseria Gonorrhea: NEGATIVE
Trichomonas: NEGATIVE

## 2024-09-18 LAB — THYROID PANEL WITH TSH
Free Thyroxine Index: 1.9 (ref 1.2–4.9)
T3 Uptake Ratio: 26 % (ref 24–39)
T4, Total: 7.4 ug/dL (ref 4.5–12.0)
TSH: 1.32 u[IU]/mL (ref 0.450–4.500)

## 2024-09-18 LAB — LIPID PANEL
Chol/HDL Ratio: 6.9 ratio — ABNORMAL HIGH (ref 0.0–4.4)
Cholesterol, Total: 222 mg/dL — ABNORMAL HIGH (ref 100–199)
HDL: 32 mg/dL — ABNORMAL LOW
LDL Chol Calc (NIH): 133 mg/dL — ABNORMAL HIGH (ref 0–99)
Triglycerides: 317 mg/dL — ABNORMAL HIGH (ref 0–149)
VLDL Cholesterol Cal: 57 mg/dL — ABNORMAL HIGH (ref 5–40)

## 2024-09-18 LAB — VITAMIN D 25 HYDROXY (VIT D DEFICIENCY, FRACTURES): Vit D, 25-Hydroxy: 30 ng/mL (ref 30.0–100.0)

## 2024-09-23 ENCOUNTER — Other Ambulatory Visit (HOSPITAL_COMMUNITY): Payer: Self-pay

## 2024-10-14 ENCOUNTER — Other Ambulatory Visit: Payer: Self-pay | Admitting: Nurse Practitioner

## 2024-10-14 DIAGNOSIS — J4520 Mild intermittent asthma, uncomplicated: Secondary | ICD-10-CM

## 2024-10-18 ENCOUNTER — Ambulatory Visit
